# Patient Record
Sex: Male | Born: 1970 | Race: White | Hispanic: No | Marital: Married | Smoking: Never smoker
Health system: Southern US, Community
[De-identification: ages and names within clinical notes are randomized; demographics above are authoritative.]

## PROBLEM LIST (undated history)

## (undated) DIAGNOSIS — M51379 Other intervertebral disc degeneration, lumbosacral region without mention of lumbar back pain or lower extremity pain: Secondary | ICD-10-CM

## (undated) DIAGNOSIS — R351 Nocturia: Secondary | ICD-10-CM

## (undated) DIAGNOSIS — N200 Calculus of kidney: Secondary | ICD-10-CM

## (undated) DIAGNOSIS — Z8051 Family history of malignant neoplasm of kidney: Secondary | ICD-10-CM

## (undated) DIAGNOSIS — T8859XA Other complications of anesthesia, initial encounter: Secondary | ICD-10-CM

## (undated) DIAGNOSIS — K4090 Unilateral inguinal hernia, without obstruction or gangrene, not specified as recurrent: Secondary | ICD-10-CM

## (undated) DIAGNOSIS — G930 Cerebral cysts: Secondary | ICD-10-CM

## (undated) DIAGNOSIS — R053 Chronic cough: Secondary | ICD-10-CM

## (undated) DIAGNOSIS — M5137 Other intervertebral disc degeneration, lumbosacral region: Secondary | ICD-10-CM

## (undated) DIAGNOSIS — K219 Gastro-esophageal reflux disease without esophagitis: Secondary | ICD-10-CM

## (undated) DIAGNOSIS — N281 Cyst of kidney, acquired: Secondary | ICD-10-CM

## (undated) HISTORY — DX: Gastro-esophageal reflux disease without esophagitis: K21.9

## (undated) HISTORY — DX: Other intervertebral disc degeneration, lumbosacral region: M51.37

## (undated) HISTORY — PX: LUMBAR MICRODISCECTOMY: SHX99

## (undated) HISTORY — PX: BACK SURGERY: SHX140

---

## 2000-12-31 ENCOUNTER — Encounter: Payer: Self-pay | Admitting: Emergency Medicine

## 2000-12-31 ENCOUNTER — Emergency Department (HOSPITAL_COMMUNITY): Admission: EM | Admit: 2000-12-31 | Discharge: 2000-12-31 | Payer: Self-pay | Admitting: Emergency Medicine

## 2002-12-09 ENCOUNTER — Emergency Department (HOSPITAL_COMMUNITY): Admission: EM | Admit: 2002-12-09 | Discharge: 2002-12-09 | Payer: Self-pay | Admitting: Emergency Medicine

## 2002-12-10 ENCOUNTER — Emergency Department (HOSPITAL_COMMUNITY): Admission: EM | Admit: 2002-12-10 | Discharge: 2002-12-10 | Payer: Self-pay | Admitting: Internal Medicine

## 2005-07-13 ENCOUNTER — Ambulatory Visit: Payer: Self-pay | Admitting: Orthopedic Surgery

## 2005-07-20 ENCOUNTER — Ambulatory Visit (HOSPITAL_COMMUNITY)
Admission: RE | Admit: 2005-07-20 | Discharge: 2005-07-20 | Payer: Self-pay | Admitting: Physical Medicine and Rehabilitation

## 2005-07-22 ENCOUNTER — Ambulatory Visit (HOSPITAL_COMMUNITY)
Admission: RE | Admit: 2005-07-22 | Discharge: 2005-07-22 | Payer: Self-pay | Admitting: Physical Medicine and Rehabilitation

## 2005-07-22 HISTORY — PX: BACK SURGERY: SHX140

## 2005-07-24 ENCOUNTER — Ambulatory Visit (HOSPITAL_COMMUNITY): Admission: RE | Admit: 2005-07-24 | Discharge: 2005-07-26 | Payer: Self-pay | Admitting: Orthopedic Surgery

## 2005-07-24 ENCOUNTER — Encounter (INDEPENDENT_AMBULATORY_CARE_PROVIDER_SITE_OTHER): Payer: Self-pay | Admitting: Specialist

## 2005-10-19 ENCOUNTER — Ambulatory Visit (HOSPITAL_COMMUNITY): Admission: RE | Admit: 2005-10-19 | Discharge: 2005-10-19 | Payer: Self-pay | Admitting: Orthopedic Surgery

## 2005-11-26 ENCOUNTER — Ambulatory Visit (HOSPITAL_COMMUNITY): Admission: RE | Admit: 2005-11-26 | Discharge: 2005-11-27 | Payer: Self-pay | Admitting: Orthopedic Surgery

## 2005-11-26 ENCOUNTER — Encounter (INDEPENDENT_AMBULATORY_CARE_PROVIDER_SITE_OTHER): Payer: Self-pay | Admitting: *Deleted

## 2009-03-12 ENCOUNTER — Emergency Department (HOSPITAL_COMMUNITY): Admission: EM | Admit: 2009-03-12 | Discharge: 2009-03-13 | Payer: Self-pay | Admitting: Emergency Medicine

## 2009-09-24 ENCOUNTER — Ambulatory Visit (HOSPITAL_COMMUNITY): Admission: RE | Admit: 2009-09-24 | Discharge: 2009-09-24 | Payer: Self-pay | Admitting: Otolaryngology

## 2010-10-12 ENCOUNTER — Encounter: Payer: Self-pay | Admitting: Orthopedic Surgery

## 2011-02-06 NOTE — Op Note (Signed)
NAMEWENDELIN, [name]                ACCOUNT NO.:  [id number]   MEDICAL RECORD NO.:  [id number]          PATIENT TYPE:  OIB   LOCATION:  1514                         FACILITY:  [facility]   PHYSICIAN:  Marlowe Kays, M.D.  DATE OF BIRTH:  10-Mar-1971   DATE OF PROCEDURE:  DATE OF DISCHARGE:                                 OPERATIVE REPORT   PREOPERATIVE DIAGNOSES:  Herniated nucleus pulposus with free fragments, L5-  S1 left.   POSTOPERATIVE DIAGNOSES:  Herniated nucleus pulposus with free fragments, L5-  S1 left.   OPERATION:  Microdiskectomy L5-S1 left.   SURGEON:  Marlowe Kays, M.D.   ASSISTANT:  Ranee Gosselin, M.D.   ANESTHESIA:  General.   PATHOLOGY AND JUSTIFICATION FOR PROCEDURE:  He has had a 2+ week history of  severe back and left leg pain. I saw him in the office yesterday with severe  pain with weakness of plantar flexion on his left foot and an MRI which had  been obtained several days prior demonstrating the above pathology. He is  here today for the above-mentioned surgery.   PROCEDURE:  Prophylactic antibiotics, satisfactory general anesthesia, knee-  chest position on the Andrews frame, back was prepped with DuraPrep and with  three spinal needles and a lateral x-ray, I tentatively located L5-S1 space  and then continued draping the back in a sterile field. I made a midline  incision based on initial x-ray. The spinous process immediately in the  wound was tagged with a Kocher clamp and which we determined to be S1 with  the disk space just above it. Accordingly, I dissected the soft tissue off  the lamina of L5 and the sacrum which we anatomically identified as well. I  placed a self-retaining McCullough retractor. With small curette, I  undermined the sacrum and with a combination of 2 and 3 mm Kerrison rongeurs  began removing bone and ligamentum flavum working laterally and superiorly.  I then brought in the microscope to continue the more were detailed  decompression. The S1 nerve root was identified and just above the disk  space there was a large amount of disk material compressing it against the  lateral recess. This was freed up with the nerve hook and with the straight  pituitary I removed two huge fragments of disk material. This allowed [place] to  mobilize the nerve root medially, a number of potential bleeders were  coagulated with bipolar cautery. The disk space was entered with a 15 knife  blade and a good bit of additional disk material was removed. When we had  removed all disk material obtainable, we checked the L4-5 foramen was patent  to hockey stick as was the L5-S1. There were no fragments of disk material  in the axilla. The S1 nerve root as anticipated because of his pain and  weakness was a little red. There was no bleeding on closure. The wound was  irrigated with sterile saline. I placed Gelfoam soaked in thrombin over the  disk space around the nerve and dura. I then removed the self-retaining  retractors, there was no unusual bleeding.  He was given 30 mg of Toradol IV.  The wound was then closed with interrupted #1 Vicryl in the fascia, 2-0  Vicryl in the  subcutaneous tissue and staples in the skin. Betadine Adaptic dry sterile  dressing were applied. He was gently placed on his bed and at the time of  this dictation was on his way to the recovery room in satisfactory condition  with no known complications.           ______________________________  Marlowe Kays, M.D.     JA/MEDQ  D:  07/24/2005  T:  07/24/2005  Job:  [id number]

## 2011-02-06 NOTE — Op Note (Signed)
[name], [name]                ACCOUNT NO.:  [id number]   MEDICAL RECORD NO.:  [id number]          PATIENT TYPE:  OIB   LOCATION:  1515                         [facility]ACILITY:  [facility]   PHYSICIAN:  Marlowe Kays, M.D.  DATE OF BIRTH:  04-15-1971   DATE OF PROCEDURE:  11/26/2005  DATE OF DISCHARGE:                                 OPERATIVE REPORT   PREOP DIAGNOSIS:  Recurrent herniated nucleus pulposus L5-S1, left.   POSTOP DIAGNOSIS:  Recurrent herniated nucleus pulposus L5-S1, left.   OPERATION:  Microdiskectomy for recurrent herniated nucleus pulposus L5-S1,  left.   SURGEON:  Marlowe Kays, M.D.   ASSISTANT:  Georges Lynch. Darrelyn Hillock, M.D.   ANESTHESIA:  General   DESCRIPTION OF PROCEDURE:  Original surgery was done 4 months ago, at 10  weeks after surgery despite recommendations that he do no straining or heavy  lifting for least 12 weeks after surgery, he was lifting some heavy steel  and had a sudden recurrence of left leg pain with an MRI demonstrating a  recurrent disk herniation.  Because of this, he is here today for the above-  mentioned surgery see operative description below for additional details.   PROCEDURE:  Prophylactic antibiotics, satisfactory general anesthesia, knee-  chest position on the Glandorf frame. Back was prepped DuraPrep and draped in  a sterile field. Ioban employed. I excised the previous scar and went down  to the spinous processes beneath the incision, tagging, with Kocher clamps;  and found that we were actually on the sacrum. I then extended the incision  more proximally and identified what appeared to be the spinous process of L5  and S1; and I placed a curette in the interlaminar space with a second  lateral x-ray confirming that we were indeed it L5-S1. Based on this, I  continued dissecting soft tissue off the lamina of L5 and S1 using a  combination of curette and double action rongeur so we could identify the  perimeters of the previous  bony resection.  I then use combination to 2-and-  3-mm Kerrison rongeurs to begin removing bone from the superior portion of  sacrum and the inferior lamina of L5, working laterally. We then brought in  the microscope to complete the micro dissection. There was good bit of bone  laterally compressing the S1 nerve root and dura. The nerve root and the  dura were freed up. There were also extensive adhesions around the S1 nerve  root which we freed up as well. Mobilizing the dura medially a large disk  fragment exuded forth; and it is removed as one single large piece, I then  entered the interspace and found multiple other small pieces, removing all  disk material obtainable.   At the conclusion of the this, the dura and the S1 nerve root were freely  movable. There was some minor venous bleeding which we partially controlled  with bipolar cautery and finally with Gelfoam, and the wound appeared dry on  closure. After irrigating the wound well with sterile saline, he was given  30 mg of Toradol IV.  The self-retaining  McCullough retractors were removed  and the wound closed with interrupted #1 Vicryl in the fascia leaving a 1-2  cm opening superiorly in case there was any bloody oozing. I then closed the  subcu tissue with a combination of #1 and  2-0 Vicryl, and the skin with staples. The soft subcutaneous tissues were  infiltrated with 1/2% plain Marcaine. Betadine, Adaptic, and dry sterile  dressings were applied. He was gently placed on this bed and taken to the  respiratory rate in satisfactory condition with no new complications.           ______________________________  Marlowe Kays, M.D.     JA/MEDQ  D:  11/26/2005  T:  11/27/2005  Job:  [id number]

## 2012-02-16 ENCOUNTER — Other Ambulatory Visit (HOSPITAL_COMMUNITY): Payer: Self-pay | Admitting: Orthopedic Surgery

## 2012-02-16 DIAGNOSIS — M5126 Other intervertebral disc displacement, lumbar region: Secondary | ICD-10-CM

## 2012-02-17 ENCOUNTER — Ambulatory Visit (HOSPITAL_COMMUNITY)
Admission: RE | Admit: 2012-02-17 | Discharge: 2012-02-17 | Disposition: A | Discharge status: Home / Self Care | Payer: 59 | Source: Ambulatory Visit | Attending: Orthopedic Surgery | Admitting: Orthopedic Surgery

## 2012-02-17 DIAGNOSIS — M5126 Other intervertebral disc displacement, lumbar region: Secondary | ICD-10-CM

## 2012-02-17 DIAGNOSIS — M545 Low back pain, unspecified: Secondary | ICD-10-CM | POA: Insufficient documentation

## 2012-02-19 ENCOUNTER — Other Ambulatory Visit (HOSPITAL_COMMUNITY): Payer: Self-pay

## 2012-03-21 HISTORY — PX: LUMBAR SPINE SURGERY: SHX701

## 2012-06-06 ENCOUNTER — Other Ambulatory Visit (HOSPITAL_COMMUNITY): Payer: Self-pay | Admitting: Orthopedic Surgery

## 2012-06-06 ENCOUNTER — Ambulatory Visit (HOSPITAL_COMMUNITY)
Admission: RE | Admit: 2012-06-06 | Discharge: 2012-06-06 | Disposition: A | Discharge status: Home / Self Care | Payer: 59 | Source: Ambulatory Visit | Attending: Orthopedic Surgery | Admitting: Orthopedic Surgery

## 2012-06-06 DIAGNOSIS — M545 Low back pain, unspecified: Secondary | ICD-10-CM | POA: Insufficient documentation

## 2012-06-06 DIAGNOSIS — M5137 Other intervertebral disc degeneration, lumbosacral region: Secondary | ICD-10-CM | POA: Insufficient documentation

## 2012-06-06 DIAGNOSIS — M5126 Other intervertebral disc displacement, lumbar region: Secondary | ICD-10-CM | POA: Insufficient documentation

## 2012-06-06 DIAGNOSIS — M79609 Pain in unspecified limb: Secondary | ICD-10-CM | POA: Insufficient documentation

## 2012-06-06 DIAGNOSIS — M51379 Other intervertebral disc degeneration, lumbosacral region without mention of lumbar back pain or lower extremity pain: Secondary | ICD-10-CM | POA: Insufficient documentation

## 2012-06-06 LAB — CREATININE, SERUM
Creatinine, Ser: 1.09 mg/dL (ref 0.50–1.35)
GFR calc Af Amer: >90 mL/min (ref >90)
GFR calc non Af Amer: 83 mL/min — ABNORMAL LOW (ref >90)

## 2012-06-06 MED ORDER — GADOBENATE DIMEGLUMINE 529 MG/ML IV SOLN
15.0000 mL | Freq: Once | INTRAVENOUS | Status: AC | PRN
  Administered 2012-06-06: 15 mL via INTRAVENOUS

## 2012-06-14 ENCOUNTER — Other Ambulatory Visit: Payer: Self-pay | Admitting: Orthopedic Surgery

## 2012-06-14 DIAGNOSIS — M5430 Sciatica, unspecified side: Secondary | ICD-10-CM

## 2012-06-16 ENCOUNTER — Ambulatory Visit
Admission: RE | Admit: 2012-06-16 | Discharge: 2012-06-16 | Disposition: A | Discharge status: Home / Self Care | Payer: 59 | Source: Ambulatory Visit | Attending: Orthopedic Surgery | Admitting: Orthopedic Surgery

## 2012-06-16 ENCOUNTER — Other Ambulatory Visit: Payer: Self-pay | Admitting: Orthopedic Surgery

## 2012-06-16 VITALS — BP 126/80 | HR 59 | Resp 12

## 2012-06-16 DIAGNOSIS — M5430 Sciatica, unspecified side: Secondary | ICD-10-CM

## 2012-06-16 MED ORDER — MIDAZOLAM HCL 2 MG/2ML IJ SOLN
1.0000 mg | INTRAMUSCULAR | Status: DC | PRN
  Administered 2012-06-16 (×2): 1 mg via INTRAVENOUS

## 2012-06-16 MED ORDER — SODIUM CHLORIDE 0.9 % IV SOLN
Freq: Once | INTRAVENOUS | Status: AC
  Administered 2012-06-16: 12:00:00 via INTRAVENOUS

## 2012-06-16 MED ORDER — IOHEXOL 180 MG/ML  SOLN
3.5000 mL | Freq: Once | INTRAMUSCULAR | Status: AC | PRN
  Administered 2012-06-16: 3.5 mL

## 2012-06-16 MED ORDER — CEFAZOLIN SODIUM 1-5 GM-% IV SOLN
1.0000 g | Freq: Once | INTRAVENOUS | Status: AC
  Administered 2012-06-16: 1 g via INTRAVENOUS

## 2012-06-16 MED ORDER — FENTANYL CITRATE 0.05 MG/ML IJ SOLN
25.0000 ug | INTRAMUSCULAR | Status: DC | PRN
  Administered 2012-06-16 (×2): 50 ug via INTRAVENOUS

## 2012-06-16 MED ORDER — KETOROLAC TROMETHAMINE 30 MG/ML IJ SOLN
30.0000 mg | Freq: Once | INTRAMUSCULAR | Status: AC
  Administered 2012-06-16: 30 mg via INTRAVENOUS

## 2013-08-28 ENCOUNTER — Other Ambulatory Visit (HOSPITAL_COMMUNITY): Payer: Self-pay | Admitting: Family Medicine

## 2013-08-28 DIAGNOSIS — IMO0002 Reserved for concepts with insufficient information to code with codable children: Secondary | ICD-10-CM

## 2013-08-28 DIAGNOSIS — M545 Low back pain: Secondary | ICD-10-CM

## 2013-08-30 ENCOUNTER — Other Ambulatory Visit (HOSPITAL_COMMUNITY): Payer: 59

## 2013-09-08 ENCOUNTER — Other Ambulatory Visit: Payer: Self-pay | Admitting: Family Medicine

## 2013-09-08 DIAGNOSIS — IMO0002 Reserved for concepts with insufficient information to code with codable children: Secondary | ICD-10-CM

## 2013-09-08 DIAGNOSIS — M545 Low back pain: Secondary | ICD-10-CM

## 2013-09-12 ENCOUNTER — Ambulatory Visit
Admission: RE | Admit: 2013-09-12 | Discharge: 2013-09-12 | Disposition: A | Discharge status: Home / Self Care | Payer: 59 | Source: Ambulatory Visit | Attending: Family Medicine | Admitting: Family Medicine

## 2013-09-12 DIAGNOSIS — IMO0002 Reserved for concepts with insufficient information to code with codable children: Secondary | ICD-10-CM

## 2013-09-12 DIAGNOSIS — M545 Low back pain: Secondary | ICD-10-CM

## 2013-09-18 ENCOUNTER — Other Ambulatory Visit: Payer: Self-pay | Admitting: Family Medicine

## 2013-09-18 DIAGNOSIS — N289 Disorder of kidney and ureter, unspecified: Secondary | ICD-10-CM

## 2013-09-22 ENCOUNTER — Ambulatory Visit
Admission: RE | Admit: 2013-09-22 | Discharge: 2013-09-22 | Disposition: A | Discharge status: Home / Self Care | Payer: 59 | Source: Ambulatory Visit | Attending: Family Medicine | Admitting: Family Medicine

## 2013-09-22 DIAGNOSIS — N289 Disorder of kidney and ureter, unspecified: Secondary | ICD-10-CM

## 2013-09-22 MED ORDER — GADOBENATE DIMEGLUMINE 529 MG/ML IV SOLN
14.0000 mL | Freq: Once | INTRAVENOUS | Status: AC | PRN
  Administered 2013-09-22: 14 mL via INTRAVENOUS

## 2013-10-30 ENCOUNTER — Other Ambulatory Visit: Payer: Self-pay | Admitting: Urology

## 2013-10-30 DIAGNOSIS — N281 Cyst of kidney, acquired: Secondary | ICD-10-CM

## 2014-01-05 ENCOUNTER — Ambulatory Visit (INDEPENDENT_AMBULATORY_CARE_PROVIDER_SITE_OTHER): Payer: 59

## 2014-01-05 DIAGNOSIS — M722 Plantar fascial fibromatosis: Secondary | ICD-10-CM

## 2014-01-05 DIAGNOSIS — R52 Pain, unspecified: Secondary | ICD-10-CM

## 2014-01-05 DIAGNOSIS — Q6650 Congenital pes planus, unspecified foot: Secondary | ICD-10-CM

## 2014-01-05 NOTE — Progress Notes (Signed)
   Subjective:    Patient ID: [name], male    DOB: 10/17/1970, 43 y.o.   MRN: [id number]  HPI my right heel has been bothering me about a month ago and sore and tender and hurts if I stand on it for a while and hurts on the heel and hurts more if I walk on concrete not so much on grass or dirt    Review of Systems  Musculoskeletal: Positive for back pain.  All other systems reviewed and are negative.      Objective:   Physical Exam Neurovascular status is intact with pedal pulses palpable DP and PT bilateral posterior were for Refill time 3 seconds epicritic and progressive sensations intact and symmetric bilateral there is normal plantar response DTRs are listed neurologically skin color pigment normal hair growth absent nails unremarkable orthopedic biomechanical exam significant pronated foot type bilateral with pes planus with has valgus deformity and abduction of the forefoot patient has tenderness in the inferior can tubercle on the right was worse 2 weeks ago status with her in a couple weeks for you. Good new balance shoes or previous he was wearing some shoes that may be worn and broken down. X-rays confirm thickening plantar fascial structures no signs of fracture no other osseous abnormalities noted small os trigone noted no inferior calcaneal spurring noted       Assessment & Plan:  Suspect has valgus/pes planus deformity with plantar fascial symptomology right foot this time fascial strapping reappointed 2 weeks for followup ice and Advil for pain ice  Alvan Dameichard Ranjit Ashurst DPM

## 2014-01-05 NOTE — Patient Instructions (Signed)
ICE INSTRUCTIONS  Apply ice or cold pack to the affected area at least 3 times a day for 10-15 minutes each time.  You should also use ice after prolonged activity or vigorous exercise.  Do not apply ice longer than 20 minutes at one time.  Always keep a cloth between your skin and the ice pack to prevent burns.  Being consistent and following these instructions will help control your symptoms.  We suggest you purchase a gel ice pack because they are reusable and do bit leak.  Some of them are designed to wrap around the area.  Use the method that works best for you.  Here are some other suggestions for icing.   Use a frozen bag of peas or corn-inexpensive and molds well to your body, usually stays frozen for 10 to 20 minutes.  Wet a towel with cold water and squeeze out the excess until it's damp.  Place in a bag in the freezer for 20 minutes. Then remove and use.  Recommended Advil or ibuprofen as needed for pain

## 2014-01-26 ENCOUNTER — Encounter (INDEPENDENT_AMBULATORY_CARE_PROVIDER_SITE_OTHER): Payer: 59

## 2014-01-26 VITALS — BP 130/79 | HR 76 | Resp 14 | Ht 70.0 in | Wt 153.0 lb

## 2014-01-26 DIAGNOSIS — R52 Pain, unspecified: Secondary | ICD-10-CM

## 2014-01-26 DIAGNOSIS — Q6650 Congenital pes planus, unspecified foot: Secondary | ICD-10-CM

## 2014-01-26 DIAGNOSIS — M722 Plantar fascial fibromatosis: Secondary | ICD-10-CM

## 2014-01-26 NOTE — Patient Instructions (Signed)

## 2014-01-26 NOTE — Progress Notes (Signed)
Pt left without being seen by Dr. Ralene CorkSikora, after waiting a hour.  Pt states he would reschedule with another doctor.

## 2014-01-26 NOTE — Progress Notes (Signed)
   Subjective:    Patient ID: [name], male    DOB: 03/03/1971, 43 y.o.   MRN: [id number]  HPI Comments: Pt states his foot is better and would like to discuss the orthotics.     Review of Systems no new changes or findings noted    Objective:   Physical Exam        Assessment & Plan:

## 2014-02-23 ENCOUNTER — Ambulatory Visit (INDEPENDENT_AMBULATORY_CARE_PROVIDER_SITE_OTHER): Payer: 59

## 2014-02-23 VITALS — BP 134/85 | HR 68 | Resp 16

## 2014-02-23 DIAGNOSIS — M722 Plantar fascial fibromatosis: Secondary | ICD-10-CM

## 2014-02-23 DIAGNOSIS — R52 Pain, unspecified: Secondary | ICD-10-CM

## 2014-02-23 DIAGNOSIS — Q6650 Congenital pes planus, unspecified foot: Secondary | ICD-10-CM

## 2014-02-23 NOTE — Patient Instructions (Signed)

## 2014-02-23 NOTE — Progress Notes (Signed)
   Subjective:    Patient ID: [name], male    DOB: Oct 24, 1970, 43 y.o.   MRN: [id number]  HPI Comments: "My foot is actually fine. Trying to decide if I want to do the orthotics"  Plantar Fasciitis - Follow up plantar heel right foot      Review of Systems no new systemic changes or findings noted     Objective:   Physical Exam Neurovascular status intact pedal pulses are palpable patient had improvement with the fascial strapping and placed and based on that is a candidate for functional orthoses understands orthotics are noncovered will make arrangements for payment plan for the orthotics. The orthotic uses explained written instructions given to the patient will skin in followup with in 4 weeks for fitting of orthotics and initiate orthotic use       Assessment & Plan:  Assessment plantar fasciitis/heel spur syndrome would benefit from functional orthoses which are prescribed at this time patient will followup in the next month orthotic pickup and fitting in the meantime also give recommendations about proper shoes athletic shoe such as new balance or Shon Baton work boots such as Redwing recommended at this time. 4 week followup  Alvan Dame DPM

## 2014-03-16 ENCOUNTER — Ambulatory Visit (INDEPENDENT_AMBULATORY_CARE_PROVIDER_SITE_OTHER): Payer: 59 | Admitting: *Deleted

## 2014-03-16 DIAGNOSIS — M722 Plantar fascial fibromatosis: Secondary | ICD-10-CM

## 2014-03-16 NOTE — Patient Instructions (Signed)

## 2014-03-16 NOTE — Progress Notes (Signed)
Dispensed patient's orthotics with oral and written instructions for wearing. Patient will follow up with Dr. Ralene CorkSikora in 6 week for an orthotic check.

## 2014-04-20 ENCOUNTER — Ambulatory Visit
Admission: RE | Admit: 2014-04-20 | Discharge: 2014-04-20 | Disposition: A | Discharge status: Home / Self Care | Payer: 59 | Source: Ambulatory Visit | Attending: Urology | Admitting: Urology

## 2014-04-20 DIAGNOSIS — N281 Cyst of kidney, acquired: Secondary | ICD-10-CM

## 2014-04-27 ENCOUNTER — Ambulatory Visit: Payer: 59

## 2014-09-12 DIAGNOSIS — M722 Plantar fascial fibromatosis: Secondary | ICD-10-CM

## 2016-10-13 DIAGNOSIS — H52223 Regular astigmatism, bilateral: Secondary | ICD-10-CM | POA: Diagnosis not present

## 2016-10-13 DIAGNOSIS — H524 Presbyopia: Secondary | ICD-10-CM | POA: Diagnosis not present

## 2016-10-13 DIAGNOSIS — H18413 Arcus senilis, bilateral: Secondary | ICD-10-CM | POA: Diagnosis not present

## 2016-10-13 DIAGNOSIS — H5201 Hypermetropia, right eye: Secondary | ICD-10-CM | POA: Diagnosis not present

## 2016-12-07 DIAGNOSIS — M47816 Spondylosis without myelopathy or radiculopathy, lumbar region: Secondary | ICD-10-CM | POA: Diagnosis not present

## 2016-12-24 DIAGNOSIS — H10502 Unspecified blepharoconjunctivitis, left eye: Secondary | ICD-10-CM | POA: Diagnosis not present

## 2016-12-28 DIAGNOSIS — H10502 Unspecified blepharoconjunctivitis, left eye: Secondary | ICD-10-CM | POA: Diagnosis not present

## 2017-01-13 DIAGNOSIS — H01001 Unspecified blepharitis right upper eyelid: Secondary | ICD-10-CM | POA: Diagnosis not present

## 2017-03-09 DIAGNOSIS — M47816 Spondylosis without myelopathy or radiculopathy, lumbar region: Secondary | ICD-10-CM | POA: Diagnosis not present

## 2017-04-12 DIAGNOSIS — N281 Cyst of kidney, acquired: Secondary | ICD-10-CM | POA: Diagnosis not present

## 2017-04-12 DIAGNOSIS — N200 Calculus of kidney: Secondary | ICD-10-CM | POA: Diagnosis not present

## 2017-04-13 ENCOUNTER — Other Ambulatory Visit (HOSPITAL_COMMUNITY): Payer: Self-pay | Admitting: Neurosurgery

## 2017-04-13 DIAGNOSIS — M47816 Spondylosis without myelopathy or radiculopathy, lumbar region: Secondary | ICD-10-CM

## 2017-04-14 ENCOUNTER — Emergency Department (HOSPITAL_COMMUNITY): Payer: 59

## 2017-04-14 ENCOUNTER — Encounter (HOSPITAL_COMMUNITY): Payer: Self-pay | Admitting: *Deleted

## 2017-04-14 ENCOUNTER — Emergency Department (HOSPITAL_COMMUNITY)
Admission: EM | Admit: 2017-04-14 | Discharge: 2017-04-14 | Disposition: A | Discharge status: Home / Self Care | Payer: 59 | Attending: Emergency Medicine | Admitting: Emergency Medicine

## 2017-04-14 DIAGNOSIS — S6992XA Unspecified injury of left wrist, hand and finger(s), initial encounter: Secondary | ICD-10-CM | POA: Diagnosis present

## 2017-04-14 DIAGNOSIS — S61216A Laceration without foreign body of right little finger without damage to nail, initial encounter: Secondary | ICD-10-CM | POA: Diagnosis not present

## 2017-04-14 DIAGNOSIS — Y999 Unspecified external cause status: Secondary | ICD-10-CM | POA: Diagnosis not present

## 2017-04-14 DIAGNOSIS — S61315A Laceration without foreign body of left ring finger with damage to nail, initial encounter: Secondary | ICD-10-CM | POA: Insufficient documentation

## 2017-04-14 DIAGNOSIS — Y9389 Activity, other specified: Secondary | ICD-10-CM | POA: Insufficient documentation

## 2017-04-14 DIAGNOSIS — Y92009 Unspecified place in unspecified non-institutional (private) residence as the place of occurrence of the external cause: Secondary | ICD-10-CM | POA: Diagnosis not present

## 2017-04-14 DIAGNOSIS — W312XXA Contact with powered woodworking and forming machines, initial encounter: Secondary | ICD-10-CM | POA: Insufficient documentation

## 2017-04-14 DIAGNOSIS — Z23 Encounter for immunization: Secondary | ICD-10-CM | POA: Insufficient documentation

## 2017-04-14 MED ORDER — TETANUS-DIPHTH-ACELL PERTUSSIS 5-2.5-18.5 LF-MCG/0.5 IM SUSP
0.5000 mL | Freq: Once | INTRAMUSCULAR | Status: AC
  Administered 2017-04-14: 0.5 mL via INTRAMUSCULAR
  Filled 2017-04-14: qty 0.5

## 2017-04-14 MED ORDER — HYDROCODONE-ACETAMINOPHEN 5-325 MG PO TABS: 1.0000 | ORAL_TABLET | ORAL | 0 refills | Status: DC | PRN

## 2017-04-14 MED ORDER — POVIDONE-IODINE 10 % EX SOLN
CUTANEOUS | Status: DC | PRN
  Filled 2017-04-14: qty 15

## 2017-04-14 MED ORDER — HYDROCODONE-ACETAMINOPHEN 5-325 MG PO TABS
1.0000 | ORAL_TABLET | Freq: Once | ORAL | Status: AC
  Administered 2017-04-14: 1 via ORAL
  Filled 2017-04-14: qty 1

## 2017-04-14 MED ORDER — LIDOCAINE HCL (PF) 2 % IJ SOLN
INTRAMUSCULAR | Status: AC
  Filled 2017-04-14: qty 10

## 2017-04-14 MED ORDER — CEPHALEXIN 500 MG PO CAPS: 500.0000 mg | ORAL_CAPSULE | Freq: Four times a day (QID) | ORAL | 0 refills | Status: DC

## 2017-04-14 MED ORDER — LIDOCAINE HCL (PF) 2 % IJ SOLN: 2.0000 mL | Freq: Once | INTRAMUSCULAR | Status: DC

## 2017-04-14 NOTE — Discharge Instructions (Signed)
Keep your finger clean, dry and covered.  The stitches will fall away on their own.  Take the antibiotic until completed.  As this injury continues to heal, you will need to keep the distal nail cut extremely short and you may want to consider continued use of the splint and tell the nail has completely grown out be on the basis of its injury.  Call Dr. Romeo AppleHarrison or return here for any problems or concerns with this injury.You may take the hydrocodone prescribed for pain relief.  This will make you drowsy - do not drive within 4 hours of taking this medication.

## 2017-04-14 NOTE — ED Provider Notes (Signed)
[facility]-EMERGENCY DEPT Provider Note   CSN: [id number] Arrival date & time: 04/14/17  1654     History   Chief Complaint Chief Complaint  Patient presents with  . Finger Injury    HPI [name] is a 46 y.o. male presenting with fingertip injury occurring just prior to arrival.  He was working at home using a milling machine cutting wood when he lacerated his left ring finger through the nail plate.  He reports copious bleeding and persistent pain at the finger tip, bleeding resolved with pressure application.  He denies numbness, radiation of pain or other injury.  He is not current with his tetanus.  The history is provided by the patient.    Past Medical History:  Diagnosis Date  . GERD (gastroesophageal reflux disease)     There are no active problems to display for this patient.   Past Surgical History:  Procedure Laterality Date  . BACK SURGERY  07/22/2005   to remove fragments and pieces that had come loose  . BACK SURGERY         Home Medications    Prior to Admission medications   Medication Sig Start Date End Date Taking? Authorizing Provider  cephALEXin (KEFLEX) 500 MG capsule Take 1 capsule (500 mg total) by mouth 4 (four) times daily. 04/14/17   Burgess AmorIdol, Meril Dray, PA-C  HYDROcodone-acetaminophen (NORCO/VICODIN) 5-325 MG tablet Take 1 tablet by mouth every 4 (four) hours as needed. 04/14/17   Burgess AmorIdol, Xochilth Standish, PA-C    Family History No family history on file.  Social History Social History  Substance Use Topics  . Smoking status: Never Smoker  . Smokeless tobacco: Never Used  . Alcohol use No     Allergies   Patient has no known allergies.   Review of Systems Review of Systems  Constitutional: Negative for fever.  Musculoskeletal: Positive for arthralgias. Negative for joint swelling and myalgias.  Skin: Positive for wound.  Neurological: Negative for weakness and numbness.     Physical Exam Updated Vital Signs BP 130/81   Pulse (!) 52    Temp 97.7 F (36.5 C) (Oral)   Resp 18   Ht 5\' 10"  (1.778 m)   Wt 70.3 kg (155 lb)   SpO2 100%   BMI 22.24 kg/m   Physical Exam  Constitutional: He is oriented to person, place, and time. He appears well-developed and well-nourished.  HENT:  Head: Normocephalic.  Cardiovascular: Normal rate.   Pulmonary/Chest: Effort normal.  Musculoskeletal: He exhibits tenderness. He exhibits no deformity.  Neurological: He is alert and oriented to person, place, and time. No sensory deficit.  Skin: Laceration noted.  Small laceration involving the nailplate and bed of the left ring finger.  There is 2 separate fractures through the distal nail plate forming 2 triangular separate pieces of nail, one firmly attached to the bed the other looser but attached. The looser wedge is atttached to the nail bed with a deep avulsive portion of tissue attached to the plate, phalange is not visualized within the base of this wound.   Distal sensation intact.  No bony deformity appreciated.     ED Treatments / Results  Labs (all labs ordered are listed, but only abnormal results are displayed) Labs Reviewed - No data to display  EKG  EKG Interpretation None       Radiology Dg Finger Ring Left  Result Date: 04/14/2017 CLINICAL DATA:  Recent laceration to the distal fourth digit, initial encounter EXAM: LEFT RING FINGER 2+V  COMPARISON:  None. FINDINGS: There is no evidence of fracture or dislocation. There is no evidence of arthropathy or other focal bone abnormality. Soft tissues are unremarkable. IMPRESSION: No acute abnormality noted. Electronically Signed   By: Alcide CleverMark  Lukens M.D.   On: 04/14/2017 18:20    Procedures Procedures (including critical care time)  LACERATION REPAIR Performed by: Burgess AmorIDOL, Tracina Beaumont Authorized by: Burgess AmorIDOL, Ottie Neglia Consent: Verbal consent obtained. Risks and benefits: risks, benefits and alternatives were discussed Consent given by: patient Patient identity confirmed: provided  demographic data Prepped and Draped in normal sterile fashion Wound explored  Laceration Location: left ring finger  Laceration Length: 0.5 cm  No Foreign Bodies seen or palpated  Anesthesia: digital block  Local anesthetic: lidocaine 2% without epinephrine  Anesthetic total: 2 ml  Irrigation method: syringe using saline after betadine wash.  Amount of cleaning: copious  Skin closure: simple gut 4-0  Number of sutures: 2 tacking stitches through nail plate  Technique: simple interupted  Patient tolerance: Patient tolerated the procedure well with no immediate complications.   Medications Ordered in ED Medications  lidocaine (XYLOCAINE) 2 % injection 2 mL (not administered)  povidone-iodine (BETADINE) 10 % external solution (not administered)  lidocaine (XYLOCAINE) 2 % injection (not administered)  Tdap (BOOSTRIX) injection 0.5 mL (0.5 mLs Intramuscular Given 04/14/17 1950)  HYDROcodone-acetaminophen (NORCO/VICODIN) 5-325 MG per tablet 1 tablet (1 tablet Oral Given 04/14/17 2021)     Initial Impression / Assessment and Plan / ED Course  I have reviewed the triage vital signs and the nursing notes.  Pertinent labs & imaging results that were available during my care of the patient were reviewed by me and considered in my medical decision making (see chart for details).     Pt given tetanus, prescribed keflex and hydrocodone for pain relief. Advised f/u with ortho for f/u care, discussed the nail bed needs to heal completely to avoid chronic nail deformity. I am hopeful that using the nail as a splint will allow the deeper injury to heal without sequalae. F/u recommened and referral to Dr Romeo AppleHarrison given.   Final Clinical Impressions(s) / ED Diagnoses   Final diagnoses:  Laceration of left ring finger without foreign body with damage to nail, initial encounter    New Prescriptions New Prescriptions   CEPHALEXIN (KEFLEX) 500 MG CAPSULE    Take 1 capsule (500 mg total)  by mouth 4 (four) times daily.   HYDROCODONE-ACETAMINOPHEN (NORCO/VICODIN) 5-325 MG TABLET    Take 1 tablet by mouth every 4 (four) hours as needed.     Burgess Amordol, Kaileigh Viswanathan, PA-C 04/15/17 Leatha [id number]    James, Mark, MD 04/29/17 [phone]

## 2017-04-14 NOTE — ED Triage Notes (Signed)
Pt was using a milling machine today and cut his left middle finger. Cut appears to be on the nail. No bleeding at this time.

## 2017-04-19 ENCOUNTER — Ambulatory Visit (HOSPITAL_COMMUNITY)
Admission: RE | Admit: 2017-04-19 | Discharge: 2017-04-19 | Disposition: A | Discharge status: Home / Self Care | Payer: 59 | Source: Ambulatory Visit | Attending: Neurosurgery | Admitting: Neurosurgery

## 2017-04-19 DIAGNOSIS — M47816 Spondylosis without myelopathy or radiculopathy, lumbar region: Secondary | ICD-10-CM | POA: Insufficient documentation

## 2017-04-19 DIAGNOSIS — M5126 Other intervertebral disc displacement, lumbar region: Secondary | ICD-10-CM | POA: Diagnosis not present

## 2017-04-19 DIAGNOSIS — M5136 Other intervertebral disc degeneration, lumbar region: Secondary | ICD-10-CM | POA: Diagnosis not present

## 2017-04-19 MED ORDER — GADOBENATE DIMEGLUMINE 529 MG/ML IV SOLN
15.0000 mL | Freq: Once | INTRAVENOUS | Status: AC | PRN
  Administered 2017-04-19: 14 mL via INTRAVENOUS

## 2017-04-27 DIAGNOSIS — S61315S Laceration without foreign body of left ring finger with damage to nail, sequela: Secondary | ICD-10-CM | POA: Diagnosis not present

## 2017-04-27 DIAGNOSIS — Z4802 Encounter for removal of sutures: Secondary | ICD-10-CM | POA: Diagnosis not present

## 2017-04-28 DIAGNOSIS — M47816 Spondylosis without myelopathy or radiculopathy, lumbar region: Secondary | ICD-10-CM | POA: Diagnosis not present

## 2017-05-17 DIAGNOSIS — M5441 Lumbago with sciatica, right side: Secondary | ICD-10-CM | POA: Diagnosis not present

## 2017-05-17 DIAGNOSIS — M5442 Lumbago with sciatica, left side: Secondary | ICD-10-CM | POA: Diagnosis not present

## 2017-05-17 DIAGNOSIS — G8929 Other chronic pain: Secondary | ICD-10-CM | POA: Diagnosis not present

## 2017-06-29 DIAGNOSIS — M5137 Other intervertebral disc degeneration, lumbosacral region: Secondary | ICD-10-CM | POA: Diagnosis not present

## 2017-06-29 DIAGNOSIS — M545 Low back pain: Secondary | ICD-10-CM | POA: Diagnosis not present

## 2017-06-29 DIAGNOSIS — G8929 Other chronic pain: Secondary | ICD-10-CM | POA: Diagnosis not present

## 2017-06-30 DIAGNOSIS — M4317 Spondylolisthesis, lumbosacral region: Secondary | ICD-10-CM | POA: Diagnosis not present

## 2017-06-30 DIAGNOSIS — M5417 Radiculopathy, lumbosacral region: Secondary | ICD-10-CM | POA: Diagnosis not present

## 2017-06-30 DIAGNOSIS — G894 Chronic pain syndrome: Secondary | ICD-10-CM | POA: Diagnosis not present

## 2017-06-30 DIAGNOSIS — Z79891 Long term (current) use of opiate analgesic: Secondary | ICD-10-CM | POA: Diagnosis not present

## 2017-07-02 DIAGNOSIS — M545 Low back pain: Secondary | ICD-10-CM | POA: Diagnosis not present

## 2017-07-02 DIAGNOSIS — G8929 Other chronic pain: Secondary | ICD-10-CM | POA: Diagnosis not present

## 2017-07-16 DIAGNOSIS — M545 Low back pain: Secondary | ICD-10-CM | POA: Diagnosis not present

## 2017-07-16 DIAGNOSIS — G8929 Other chronic pain: Secondary | ICD-10-CM | POA: Diagnosis not present

## 2017-07-20 DIAGNOSIS — G8929 Other chronic pain: Secondary | ICD-10-CM | POA: Diagnosis not present

## 2017-07-20 DIAGNOSIS — M545 Low back pain: Secondary | ICD-10-CM | POA: Diagnosis not present

## 2017-07-21 DIAGNOSIS — M47817 Spondylosis without myelopathy or radiculopathy, lumbosacral region: Secondary | ICD-10-CM | POA: Diagnosis not present

## 2017-07-21 DIAGNOSIS — S6992XA Unspecified injury of left wrist, hand and finger(s), initial encounter: Secondary | ICD-10-CM | POA: Diagnosis not present

## 2017-07-23 DIAGNOSIS — G8929 Other chronic pain: Secondary | ICD-10-CM | POA: Diagnosis not present

## 2017-07-23 DIAGNOSIS — M545 Low back pain: Secondary | ICD-10-CM | POA: Diagnosis not present

## 2017-07-27 DIAGNOSIS — G8929 Other chronic pain: Secondary | ICD-10-CM | POA: Diagnosis not present

## 2017-07-27 DIAGNOSIS — M545 Low back pain: Secondary | ICD-10-CM | POA: Diagnosis not present

## 2017-08-02 DIAGNOSIS — G8929 Other chronic pain: Secondary | ICD-10-CM | POA: Diagnosis not present

## 2017-08-02 DIAGNOSIS — M545 Low back pain: Secondary | ICD-10-CM | POA: Diagnosis not present

## 2017-08-05 DIAGNOSIS — G8929 Other chronic pain: Secondary | ICD-10-CM | POA: Diagnosis not present

## 2017-08-05 DIAGNOSIS — M545 Low back pain: Secondary | ICD-10-CM | POA: Diagnosis not present

## 2017-08-10 DIAGNOSIS — M545 Low back pain: Secondary | ICD-10-CM | POA: Diagnosis not present

## 2017-08-10 DIAGNOSIS — G8929 Other chronic pain: Secondary | ICD-10-CM | POA: Diagnosis not present

## 2017-08-16 DIAGNOSIS — G8929 Other chronic pain: Secondary | ICD-10-CM | POA: Diagnosis not present

## 2017-08-16 DIAGNOSIS — M545 Low back pain: Secondary | ICD-10-CM | POA: Diagnosis not present

## 2017-08-19 DIAGNOSIS — M47817 Spondylosis without myelopathy or radiculopathy, lumbosacral region: Secondary | ICD-10-CM | POA: Diagnosis not present

## 2017-08-19 DIAGNOSIS — G894 Chronic pain syndrome: Secondary | ICD-10-CM | POA: Diagnosis not present

## 2017-08-19 DIAGNOSIS — M5416 Radiculopathy, lumbar region: Secondary | ICD-10-CM | POA: Diagnosis not present

## 2017-08-25 DIAGNOSIS — M4716 Other spondylosis with myelopathy, lumbar region: Secondary | ICD-10-CM | POA: Diagnosis not present

## 2017-09-03 DIAGNOSIS — M5416 Radiculopathy, lumbar region: Secondary | ICD-10-CM | POA: Diagnosis not present

## 2017-09-16 DIAGNOSIS — G894 Chronic pain syndrome: Secondary | ICD-10-CM | POA: Diagnosis not present

## 2017-09-16 DIAGNOSIS — M47817 Spondylosis without myelopathy or radiculopathy, lumbosacral region: Secondary | ICD-10-CM | POA: Diagnosis not present

## 2017-09-16 DIAGNOSIS — M5417 Radiculopathy, lumbosacral region: Secondary | ICD-10-CM | POA: Diagnosis not present

## 2018-07-21 IMAGING — MR MR LUMBAR SPINE WO/W CM
4 of 7 series · 15 of 48 positions shown · IV contrast (14ml Multihance)
Comparison: CT lumbar discography dated June 16, 2012.

CLINICAL DATA: Low back pain radiating down the left leg. No known
injury.

EXAM:
MRI LUMBAR SPINE WITHOUT AND WITH CONTRAST
TECHNIQUE: Multiplanar and multiecho pulse sequences of the lumbar spine were
obtained without and with intravenous contrast.
CONTRAST:  14mL MULTIHANCE GADOBENATE DIMEGLUMINE 529 MG/ML IV SOLN

[Series 3: T1 · sagittal · 4.0mm · 0.47mm/px · 3 of 13 slices shown (1 of 2)]
[im 1/13]
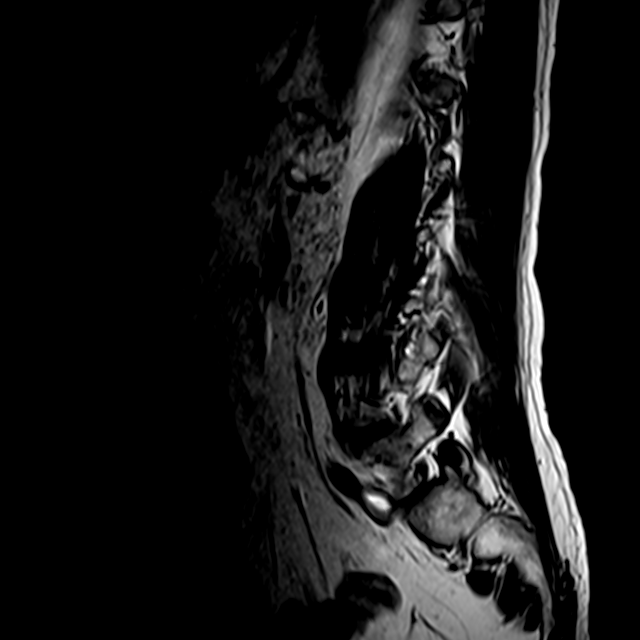
[im 7/13]
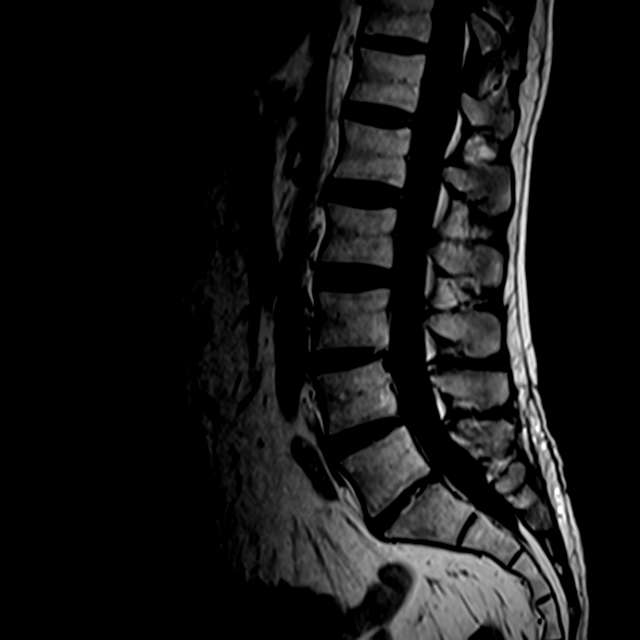
[im 13/13]
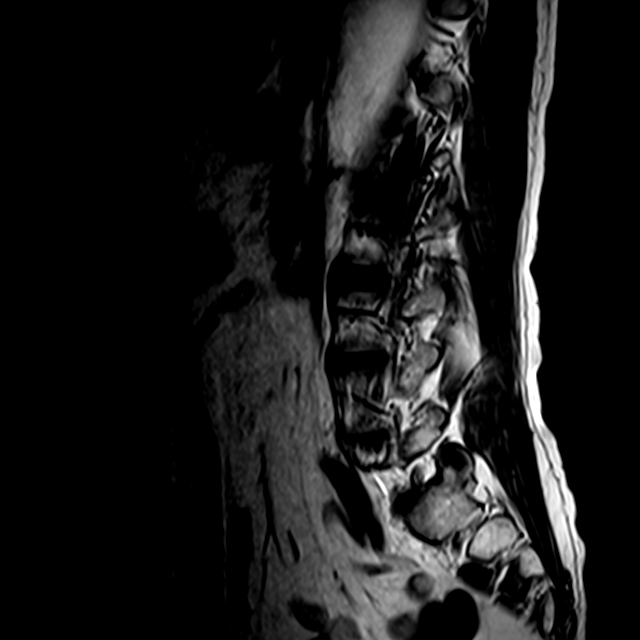

[Series 5: T2 · axial · 4.0mm · 0.27mm/px · z∈[-109,+68]mm · 6 of 36 slices shown (1 of 2)]
[im 1/36]
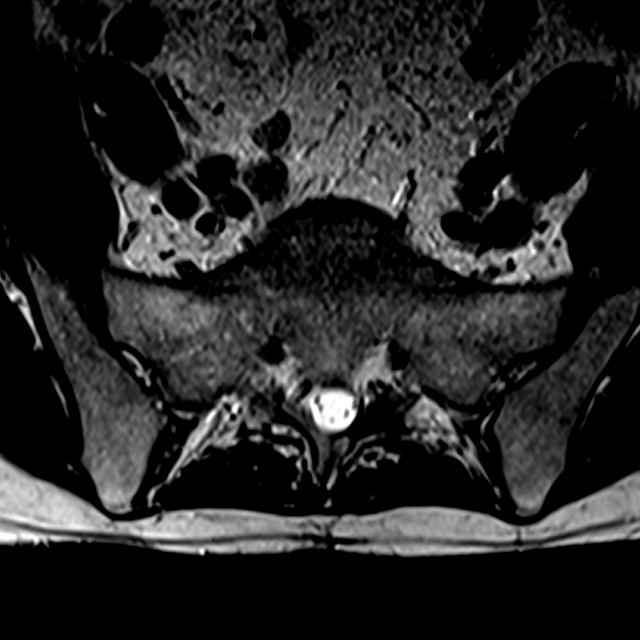
[im 4/36]
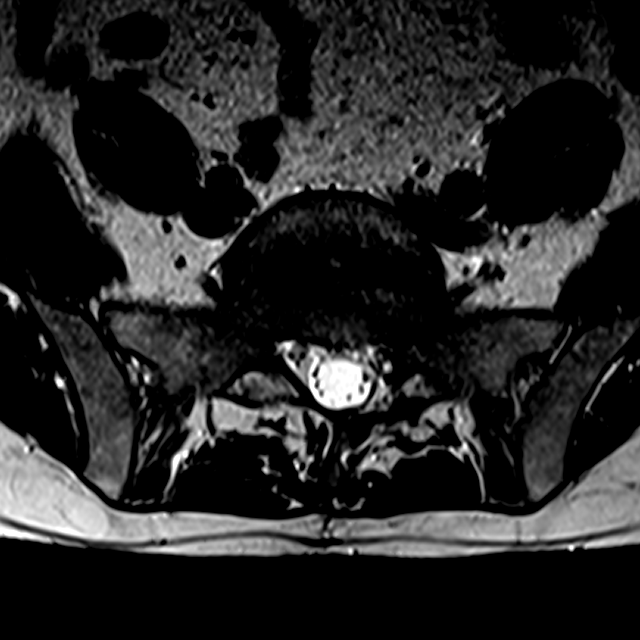
[im 8/36]
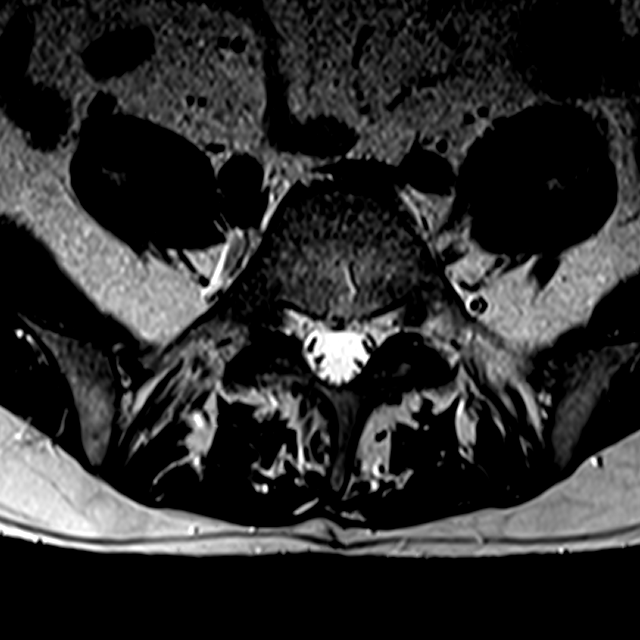
[im 11/36]
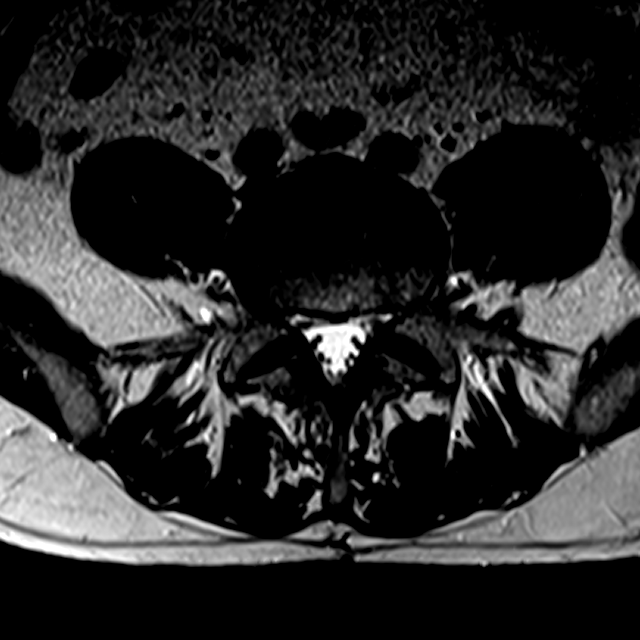
[im 18/36]
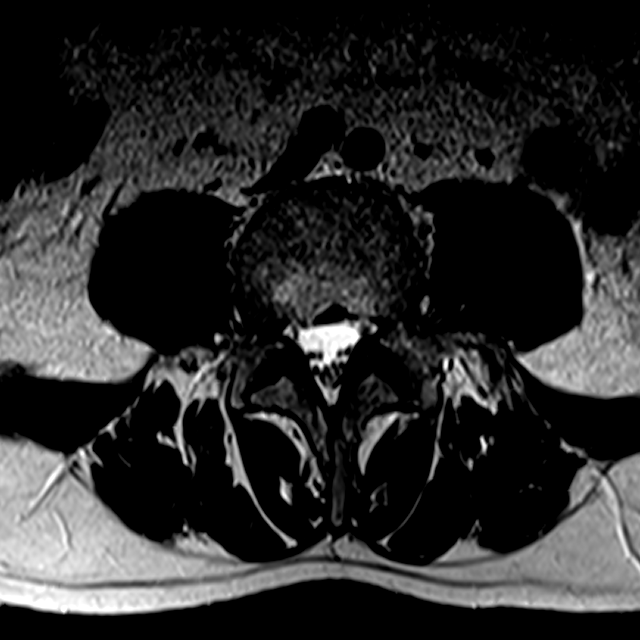
[im 32/36]
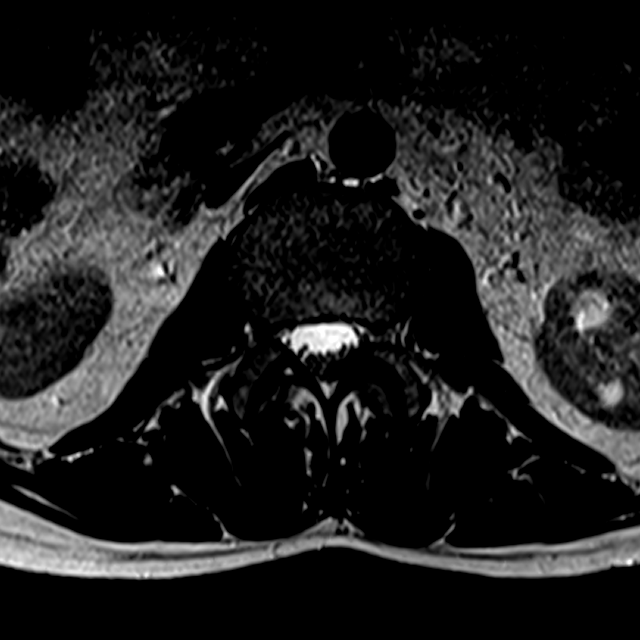

[Series 6: T1 · axial · 4.0mm · 0.27mm/px · z∈[-96,+68]mm · 3 of 36 slices shown (2 of 2)]
[im 4/36]
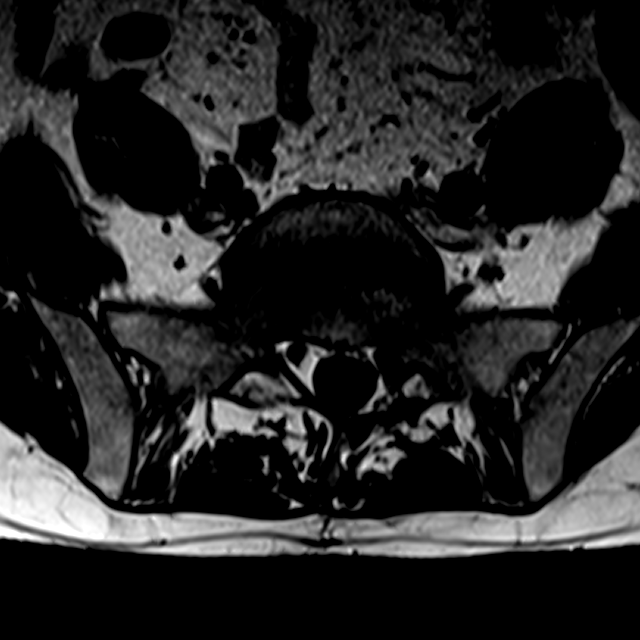
[im 18/36]
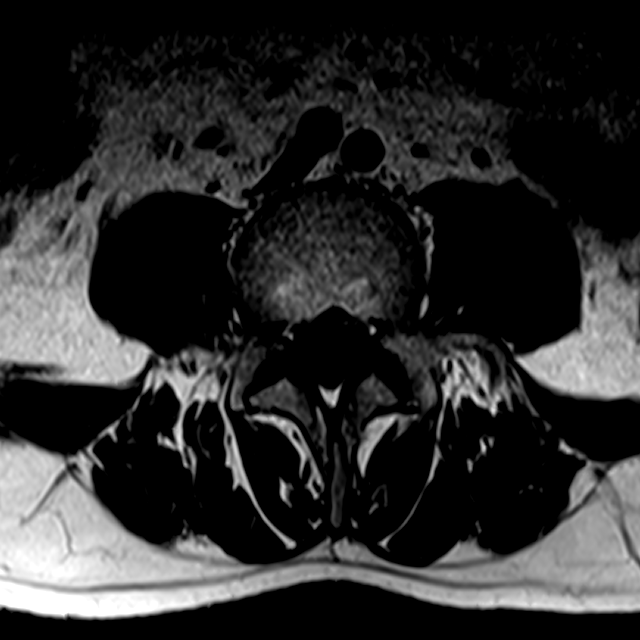
[im 32/36]
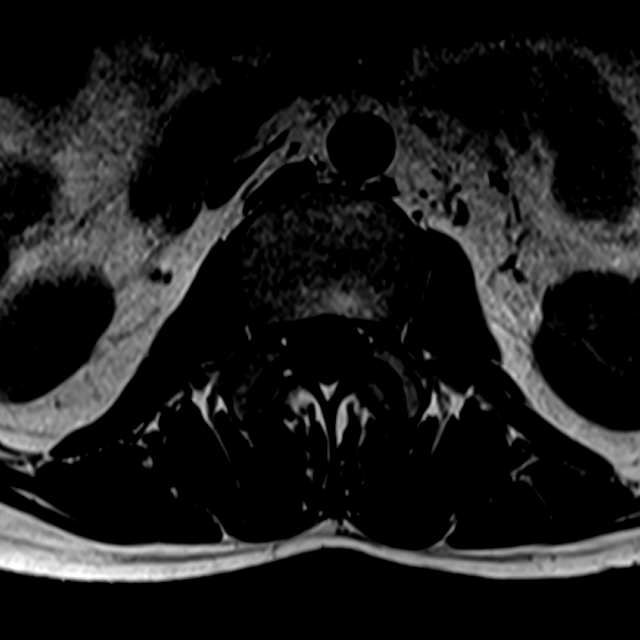

[Series 7: T2 · sagittal · 4.0mm · 0.79mm/px · 3 of 13 slices shown (2 of 2)]
[im 1/13]
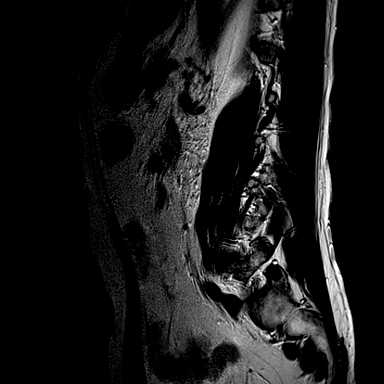
[im 9/13]
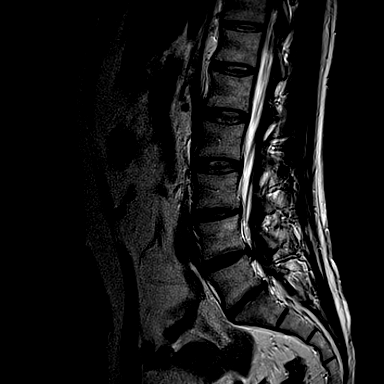
[im 13/13]
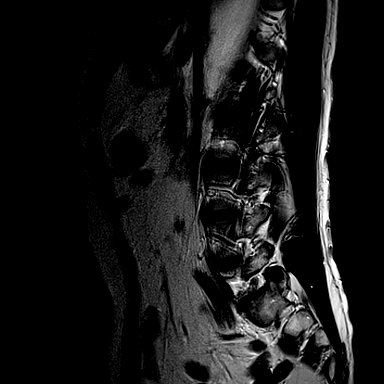

[15 of 48 positions shown; findings below may reference images not displayed]

FINDINGS: Segmentation:  Standard.

Alignment:  Physiologic.

Vertebrae:  No fracture, evidence of discitis, or bone lesion.

Conus medullaris: Extends to the L1 level and appears normal. No
abnormal enhancement identified.

Paraspinal and other soft tissues: Negative.

Disc levels:

T12-L1:  Normal.

L1-L2:  Normal.

L2-L3:  Normal.

L3-L4: Small annular fissure. Small disc bulge without spinal canal
or neuroforaminal stenosis.

L4-L5: Small annular fissure. Diffuse disc bulge slightly asymmetric
to the right resulting in moderate right lateral recess narrowing
and possible impingement on the descending right L5 nerve root. No
significant spinal canal or neuroforaminal stenosis.

L5-S1: Small diffuse disc bulge, slightly asymmetric to the left,
resulting in mild left neuroforaminal stenosis. No spinal canal or
right neuroforaminal stenosis.
IMPRESSION: 1. Mild degenerative disc disease of the lower lumbar spine with
moderate right lateral recess narrowing at L4-L5 and possible
impingement on the descending right L5 nerve root.
2. No high-grade spinal canal or neuroforaminal stenosis at any
level.

## 2020-01-30 ENCOUNTER — Emergency Department (HOSPITAL_COMMUNITY): Payer: Medicare HMO

## 2020-01-30 ENCOUNTER — Encounter (HOSPITAL_COMMUNITY): Payer: Self-pay | Admitting: Emergency Medicine

## 2020-01-30 ENCOUNTER — Emergency Department (HOSPITAL_COMMUNITY)
Admission: EM | Admit: 2020-01-30 | Discharge: 2020-01-30 | Disposition: A | Discharge status: Home / Self Care | Payer: Medicare HMO | Source: Home / Self Care | Attending: Emergency Medicine | Admitting: Emergency Medicine

## 2020-01-30 ENCOUNTER — Other Ambulatory Visit: Payer: Self-pay

## 2020-01-30 ENCOUNTER — Encounter (HOSPITAL_COMMUNITY): Payer: Self-pay

## 2020-01-30 ENCOUNTER — Emergency Department (HOSPITAL_COMMUNITY)
Admission: EM | Admit: 2020-01-30 | Discharge: 2020-01-30 | Disposition: A | Discharge status: Home / Self Care | Payer: Medicare HMO | Attending: Emergency Medicine | Admitting: Emergency Medicine

## 2020-01-30 DIAGNOSIS — K529 Noninfective gastroenteritis and colitis, unspecified: Secondary | ICD-10-CM | POA: Insufficient documentation

## 2020-01-30 DIAGNOSIS — R197 Diarrhea, unspecified: Secondary | ICD-10-CM | POA: Diagnosis not present

## 2020-01-30 DIAGNOSIS — Z5321 Procedure and treatment not carried out due to patient leaving prior to being seen by health care provider: Secondary | ICD-10-CM | POA: Insufficient documentation

## 2020-01-30 DIAGNOSIS — R1110 Vomiting, unspecified: Secondary | ICD-10-CM | POA: Diagnosis present

## 2020-01-30 DIAGNOSIS — R112 Nausea with vomiting, unspecified: Secondary | ICD-10-CM | POA: Insufficient documentation

## 2020-01-30 LAB — CBC
HCT: 55 % — ABNORMAL HIGH (ref 39.0–52.0)
Hemoglobin: 17.8 g/dL — ABNORMAL HIGH (ref 13.0–17.0)
MCH: 30.6 pg (ref 26.0–34.0)
MCHC: 32.4 g/dL (ref 30.0–36.0)
MCV: 94.7 fL (ref 80.0–100.0)
Platelets: 282 10*3/uL (ref 150–400)
RBC: 5.81 MIL/uL (ref 4.22–5.81)
RDW: 12.3 % (ref 11.5–15.5)
WBC: 10.6 10*3/uL — ABNORMAL HIGH (ref 4.0–10.5)
nRBC: 0 % (ref 0.0–0.2)

## 2020-01-30 LAB — COMPREHENSIVE METABOLIC PANEL
ALT: 25 U/L (ref 0–44)
AST: 27 U/L (ref 15–41)
Albumin: 5.3 g/dL — ABNORMAL HIGH (ref 3.5–5.0)
Alkaline Phosphatase: 53 U/L (ref 38–126)
Anion gap: 18 — ABNORMAL HIGH (ref 5–15)
BUN: 19 mg/dL (ref 6–20)
CO2: 21 mmol/L — ABNORMAL LOW (ref 22–32)
Calcium: 10.4 mg/dL — ABNORMAL HIGH (ref 8.9–10.3)
Chloride: 103 mmol/L (ref 98–111)
Creatinine, Ser: 1.2 mg/dL (ref 0.61–1.24)
GFR calc Af Amer: >60 mL/min (ref >60)
GFR calc non Af Amer: >60 mL/min (ref >60)
Glucose, Bld: 158 mg/dL — ABNORMAL HIGH (ref 70–99)
Potassium: 3.7 mmol/L (ref 3.5–5.1)
Sodium: 142 mmol/L (ref 135–145)
Total Bilirubin: 2.7 mg/dL — ABNORMAL HIGH (ref 0.3–1.2)
Total Protein: 8.1 g/dL (ref 6.5–8.1)

## 2020-01-30 LAB — URINALYSIS, ROUTINE W REFLEX MICROSCOPIC
Bacteria, UA: NONE SEEN
Bilirubin Urine: NEGATIVE
Glucose, UA: NEGATIVE mg/dL
Hgb urine dipstick: NEGATIVE
Ketones, ur: 5 mg/dL — AB
Leukocytes,Ua: NEGATIVE
Nitrite: NEGATIVE
Protein, ur: 100 mg/dL — AB
Specific Gravity, Urine: 1.029 (ref 1.005–1.030)
pH: 5 (ref 5.0–8.0)

## 2020-01-30 LAB — LIPASE, BLOOD: Lipase: 52 U/L — ABNORMAL HIGH (ref 11–51)

## 2020-01-30 MED ORDER — SODIUM CHLORIDE 0.9% FLUSH: 3.0000 mL | Freq: Once | INTRAVENOUS | Status: DC

## 2020-01-30 MED ORDER — ONDANSETRON HCL 4 MG/2ML IJ SOLN
4.0000 mg | Freq: Once | INTRAMUSCULAR | Status: AC
  Administered 2020-01-30: 4 mg via INTRAVENOUS
  Filled 2020-01-30: qty 2

## 2020-01-30 MED ORDER — IOHEXOL 300 MG/ML  SOLN
100.0000 mL | Freq: Once | INTRAMUSCULAR | Status: AC | PRN
  Administered 2020-01-30: 100 mL via INTRAVENOUS

## 2020-01-30 MED ORDER — HYDROMORPHONE HCL 1 MG/ML IJ SOLN
1.0000 mg | Freq: Once | INTRAMUSCULAR | Status: AC
  Administered 2020-01-30: 1 mg via INTRAMUSCULAR
  Filled 2020-01-30: qty 1

## 2020-01-30 MED ORDER — SODIUM CHLORIDE 0.9 % IV BOLUS
1000.0000 mL | Freq: Once | INTRAVENOUS | Status: AC
  Administered 2020-01-30: 1000 mL via INTRAVENOUS

## 2020-01-30 MED ORDER — FAMOTIDINE IN NACL 20-0.9 MG/50ML-% IV SOLN
20.0000 mg | Freq: Once | INTRAVENOUS | Status: AC
  Administered 2020-01-30: 20 mg via INTRAVENOUS
  Filled 2020-01-30: qty 50

## 2020-01-30 MED ORDER — SODIUM CHLORIDE (PF) 0.9 % IJ SOLN
INTRAMUSCULAR | Status: AC
  Filled 2020-01-30: qty 50

## 2020-01-30 NOTE — Discharge Instructions (Addendum)
It was our pleasure to provide your ER care today - we hope that you feel better.  Rest. Drink plenty of fluids.   Follow up with primary care doctor in the next 1-2 days if symptoms fail to improve/resolve.  Return to ER if worse, new symptoms, high fevers, worsening or severe pain, persistent vomiting, or other concern.   You were given pain medication in the ER - no driving for the next 6 hours.

## 2020-01-30 NOTE — ED Notes (Signed)
Patient given ginger ale and crackers. 

## 2020-01-30 NOTE — ED Notes (Signed)
Patient ambulated to restroom independently.

## 2020-01-30 NOTE — ED Provider Notes (Signed)
[facility]-EMERGENCY DEPT Provider Note   CSN: [id number] Arrival date & time: 01/30/20  2025     History Chief Complaint  Patient presents with  . Abdominal Pain    [name] is a 49 y.o. male.  Patient c/o abd pain and nausea, vomiting, diarrhea, onset last PM. Symptoms acute onset, moderate, persistent, recurrent, non radiating. Emesis clear to color ingested fluids, not bloody or bilious. Diarrhea watery, not bloody. Abd pain worse in RLQ, non radiating. No dysuria or hematuria. No cough or uri symptoms. No chest pain or discomfort. No known bad food ingestion or ill contacts. No fever.   The history is provided by the patient.  Abdominal Pain Associated symptoms: diarrhea and vomiting   Associated symptoms: no chest pain, no dysuria, no fever, no shortness of breath and no sore throat        Past Medical History:  Diagnosis Date  . GERD (gastroesophageal reflux disease)     There are no problems to display for this patient.   Past Surgical History:  Procedure Laterality Date  . BACK SURGERY  07/22/2005   to remove fragments and pieces that had come loose  . BACK SURGERY         No family history on file.  Social History   Tobacco Use  . Smoking status: Never Smoker  . Smokeless tobacco: Never Used  Substance Use Topics  . Alcohol use: No  . Drug use: No    Home Medications Prior to Admission medications   Medication Sig Start Date End Date Taking? Authorizing Provider  cephALEXin (KEFLEX) 500 MG capsule Take 1 capsule (500 mg total) by mouth 4 (four) times daily. 04/14/17   Burgess Amor, PA-C  HYDROcodone-acetaminophen (NORCO/VICODIN) 5-325 MG tablet Take 1 tablet by mouth every 4 (four) hours as needed. 04/14/17   Burgess Amor, PA-C    Allergies    Patient has no known allergies.  Review of Systems   Review of Systems  Constitutional: Negative for fever.  HENT: Negative for sore throat.   Eyes: Negative for redness.    Respiratory: Negative for shortness of breath.   Cardiovascular: Negative for chest pain.  Gastrointestinal: Positive for abdominal pain, diarrhea and vomiting.  Genitourinary: Negative for dysuria and flank pain.  Musculoskeletal: Negative for back pain and neck pain.  Skin: Negative for rash.  Neurological: Negative for headaches.  Hematological: Does not bruise/bleed easily.  Psychiatric/Behavioral: Negative for confusion.    Physical Exam Updated Vital Signs BP (!) 135/100   Pulse (!) 113   Temp 98.6 F (37 C) (Oral)   Resp 18   Ht 1.778 m (5\' 10" )   Wt 71.7 kg   SpO2 100%   BMI 22.67 kg/m   Physical Exam Vitals and nursing note reviewed.  Constitutional:      Appearance: Normal appearance. He is well-developed.  HENT:     Head: Atraumatic.     Nose: Nose normal.     Mouth/Throat:     Mouth: Mucous membranes are moist.     Pharynx: Oropharynx is clear.  Eyes:     General: No scleral icterus.    Conjunctiva/sclera: Conjunctivae normal.  Neck:     Trachea: No tracheal deviation.  Cardiovascular:     Rate and Rhythm: Normal rate and regular rhythm.     Pulses: Normal pulses.     Heart sounds: Normal heart sounds. No murmur. No friction rub. No gallop.   Pulmonary:     Effort:  Pulmonary effort is normal. No accessory muscle usage or respiratory distress.     Breath sounds: Normal breath sounds.  Abdominal:     General: Bowel sounds are normal. There is no distension.     Palpations: Abdomen is soft. There is no mass.     Tenderness: There is abdominal tenderness. There is no guarding or rebound.     Hernia: No hernia is present.     Comments: Right lower abd tenderness.   Genitourinary:    Comments: No cva tenderness. Musculoskeletal:        General: No swelling.     Cervical back: Normal range of motion and neck supple. No rigidity.  Skin:    General: Skin is warm and dry.     Findings: No rash.  Neurological:     Mental Status: He is alert.      Comments: Alert, speech clear.   Psychiatric:        Mood and Affect: Mood normal.     ED Results / Procedures / Treatments   Labs (all labs ordered are listed, but only abnormal results are displayed) Results for orders placed or performed during the hospital encounter of 01/30/20  Urinalysis, Routine w reflex microscopic  Result Value Ref Range   Color, Urine AMBER (A) YELLOW   APPearance HAZY (A) CLEAR   Specific Gravity, Urine 1.029 1.005 - 1.030   pH 5.0 5.0 - 8.0   Glucose, UA NEGATIVE NEGATIVE mg/dL   Hgb urine dipstick NEGATIVE NEGATIVE   Bilirubin Urine NEGATIVE NEGATIVE   Ketones, ur 5 (A) NEGATIVE mg/dL   Protein, ur 357 (A) NEGATIVE mg/dL   Nitrite NEGATIVE NEGATIVE   Leukocytes,Ua NEGATIVE NEGATIVE   RBC / HPF 0-5 0 - 5 RBC/hpf   WBC, UA 0-5 0 - 5 WBC/hpf   Bacteria, UA NONE SEEN NONE SEEN   Squamous Epithelial / LPF 0-5 0 - 5   Mucus PRESENT     EKG None  Radiology CT Abdomen Pelvis W Contrast  Result Date: 01/30/2020 CLINICAL DATA:  Acute abdominal pain since last night. EXAM: CT ABDOMEN AND PELVIS WITH CONTRAST TECHNIQUE: Multidetector CT imaging of the abdomen and pelvis was performed using the standard protocol following bolus administration of intravenous contrast. CONTRAST:  OMNIPAQUE IOHEXOL 300 MG/ML  SOLN COMPARISON:  MR abdomen from 2015 FINDINGS: Lower chest: Incidental imaging of the lung bases is unremarkable. Hepatobiliary: Liver is normal. Gallbladder is unremarkable. No biliary ductal dilation. Pancreas: Pancreas is normal without focal lesion or ductal dilation. Spleen: Spleen normal size without focal lesion. Adrenals/Urinary Tract: Adrenal glands are normal. Low-density lesions in the LEFT kidney, lower pole largest slightly less than a cm size likely cysts. No perinephric stranding or hydronephrosis. Nephrolithiasis in the lower pole the RIGHT kidney approximately 3 mm. Bifid renal collecting systems bilaterally. Single ureter distally  beyond the UPJ. Stomach/Bowel: Fluid-filled bowel loops distally. No sign of bowel obstruction. Question mild hyperenhancement or under distension of the terminal ileum. Fluid-filled ascending colon with under distended transverse colon no significant pericolonic stranding but with subjective increase in pericolonic vascularity. Bowel enhancement is maintained throughout the appendix is fluid-filled without stranding or wall thickening Vascular/Lymphatic: Vascular structures in the abdomen are patent. No adenopathy in the retroperitoneum or in the upper abdomen. No pelvic lymphadenopathy. Reproductive: Prostate unremarkable by CT. Urinary bladder is normal. Other: No ascites. No free air. No abdominal wall hernia. Musculoskeletal: No acute or destructive bone finding. IMPRESSION: 1. Enterocolitis is considered versus gastroenteritis. Correlate with  any history of repeated episodes of abdominal pain given the mild asymmetric hyperenhancement of the terminal ileum. GI follow-up may be helpful if there are persistent or recurrent symptoms. 2. Fluid-filled appendix without periappendiceal stranding or are dilation findings do not suggest appendicitis at this time. 3. Nonobstructive right nephrolithiasis. Electronically Signed   By: Zetta Bills M.D.   On: 01/30/2020 10:49    Procedures Procedures (including critical care time)  Medications Ordered in ED Medications  sodium chloride 0.9 % bolus 1,000 mL (has no administration in time range)  HYDROmorphone (DILAUDID) injection 1 mg (has no administration in time range)  ondansetron (ZOFRAN) injection 4 mg (has no administration in time range)  famotidine (PEPCID) IVPB 20 mg premix (has no administration in time range)    ED Course  I have reviewed the triage vital signs and the nursing notes.  Pertinent labs & imaging results that were available during my care of the patient were reviewed by me and considered in my medical decision making (see chart for  details).    MDM Rules/Calculators/A&P                      Patient had labs and [facility] ED this AM - reviewed - wbc mildly elev.   Reviewed nursing notes and prior charts for additional history.   Iv ns bolus. Dilaudid iv. pepcid iv. zofran iv.   Additional ivf.   Await CT.  CT reviewed/interpreted by me - c/w gastroenteritis.   Recheck pt, feels improved. Tolerating po.  Recheck abd soft nt.   Pt appear stable for d/c.   Return precautions provided.      Final Clinical Impression(s) / ED Diagnoses Final diagnoses:  None    Rx / DC Orders ED Discharge Orders    None       Lajean Saver, MD 01/30/20 1058

## 2020-01-30 NOTE — ED Triage Notes (Signed)
Pt states he suddenly became sick at 12am with vomiting and diarrhea. Pt is very soft spoken in triage and answers questions with one word- very hard to decipher exactly what is going on with pt. Pt is alert pt refusing to answer questions because he doesn't feel well.

## 2020-01-30 NOTE — ED Notes (Addendum)
Pt stated that he can not wait any longer. Tech advised pt to stay and be seen. Pt stated that he wants to go to a different hospital. Pt has left building. Triage nurse notified.

## 2020-01-30 NOTE — ED Notes (Signed)
Pt has been brought to lobby bathroom multiple times.

## 2020-01-30 NOTE — ED Triage Notes (Addendum)
RLQ abdominal pain since midnight. Sts vomiting and diarrhea. Sts blood and urine obtained pta at [facility].

## 2021-04-08 ENCOUNTER — Other Ambulatory Visit: Payer: Self-pay | Admitting: Sports Medicine

## 2021-04-08 DIAGNOSIS — M542 Cervicalgia: Secondary | ICD-10-CM

## 2021-04-08 DIAGNOSIS — M25512 Pain in left shoulder: Secondary | ICD-10-CM

## 2022-01-14 ENCOUNTER — Other Ambulatory Visit: Payer: Self-pay | Admitting: Physical Medicine and Rehabilitation

## 2022-01-14 ENCOUNTER — Ambulatory Visit
Admission: RE | Admit: 2022-01-14 | Discharge: 2022-01-14 | Disposition: A | Discharge status: Home / Self Care | Payer: Medicare HMO | Source: Ambulatory Visit | Attending: Physical Medicine and Rehabilitation | Admitting: Physical Medicine and Rehabilitation

## 2022-01-14 DIAGNOSIS — M25552 Pain in left hip: Secondary | ICD-10-CM

## 2022-01-28 ENCOUNTER — Other Ambulatory Visit: Payer: Self-pay | Admitting: Neurological Surgery

## 2022-01-28 DIAGNOSIS — M5416 Radiculopathy, lumbar region: Secondary | ICD-10-CM

## 2022-02-10 ENCOUNTER — Other Ambulatory Visit: Payer: Medicare HMO

## 2022-03-14 ENCOUNTER — Ambulatory Visit
Admission: RE | Admit: 2022-03-14 | Discharge: 2022-03-14 | Disposition: A | Discharge status: Home / Self Care | Payer: Medicare HMO | Source: Ambulatory Visit | Attending: Neurological Surgery | Admitting: Neurological Surgery

## 2022-03-14 DIAGNOSIS — M5416 Radiculopathy, lumbar region: Secondary | ICD-10-CM

## 2022-04-21 ENCOUNTER — Ambulatory Visit: Payer: Self-pay | Admitting: Surgery

## 2022-04-29 ENCOUNTER — Encounter (HOSPITAL_BASED_OUTPATIENT_CLINIC_OR_DEPARTMENT_OTHER): Payer: Self-pay | Admitting: Surgery

## 2022-04-29 NOTE — Progress Notes (Signed)
Spoke w/ via phone for pre-op interview--- pt Lab needs dos----   no            Lab results------ no COVID test -----patient states asymptomatic no test needed Arrive at ------- 0530 on 05-01-2022 NPO after MN NO Solid Food.  Clear liquids from MN until--- 0430 Med rec completed Medications to take morning of surgery ----- prilosec Diabetic medication ----- n/a Patient instructed no nail polish to be worn day of surgery Patient instructed to bring photo id and insurance card day of surgery Patient aware to have Driver (ride ) / caregiver for 24 hours after surgery --  wife, Shanda Bumps Patient Special Instructions ----- n/a Pre-Op special Istructions ----- n/a Patient verbalized understanding of instructions that were given at this phone interview. Patient denies shortness of breath, chest pain, fever, cough at this phone interview.

## 2022-04-30 ENCOUNTER — Encounter (HOSPITAL_BASED_OUTPATIENT_CLINIC_OR_DEPARTMENT_OTHER): Payer: Self-pay | Admitting: Surgery

## 2022-04-30 DIAGNOSIS — K4090 Unilateral inguinal hernia, without obstruction or gangrene, not specified as recurrent: Secondary | ICD-10-CM

## 2022-04-30 NOTE — H&P (Signed)
REFERRING PHYSICIAN: Princella Pellegrini, PA  PROVIDER: Skii Cleland Myra Rude, MD   Chief Complaint: New Consultation (Left Inguinal Hernia )  History of Present Illness:  Patient is referred by the urgent care where he was evaluated approximately 1 month ago for left groin pain and a visible bulge. Patient was seen and evaluated and diagnosed with left inguinal hernia. Patient did not note any particular incident or lifting injury that caused the hernia. He is restricted in his activities due to previous back surgery. He has had no signs or symptoms of obstruction. He has had no prior hernia repair. He has had no prior abdominal surgery. Patient presents today for further evaluation and recommendations for management.  Review of Systems: A complete review of systems was obtained from the patient. I have reviewed this information and discussed as appropriate with the patient. See HPI as well for other ROS.  Review of Systems  Constitutional: Negative.  HENT: Negative.  Eyes: Negative.  Respiratory: Negative.  Cardiovascular: Negative.  Gastrointestinal: Negative.  Genitourinary: Negative.  Musculoskeletal: Positive for back pain.  Left groin pain  Skin: Negative.  Neurological: Negative.  Endo/Heme/Allergies: Negative.  Psychiatric/Behavioral: Negative.   Medical History: Past Medical History:  Diagnosis Date  Arthritis  GERD (gastroesophageal reflux disease)   Patient Active Problem List  Diagnosis  Arachnoid cyst  Left inguinal hernia   Past Surgical History:  Procedure Laterality Date  Lower Back Surgery  11/2004,07/2005,03/2012    No Known Allergies  Current Outpatient Medications on File Prior to Visit  Medication Sig Dispense Refill  acetaminophen (TYLENOL) 500 MG tablet Take 1,000 mg by mouth every 8 (eight) hours as needed for Pain  multivitamin tablet Take 1 tablet by mouth once daily  nabumetone (RELAFEN) 500 MG tablet Take 500 mg by mouth 2 (two) times  daily as needed  omeprazole (PRILOSEC) 20 MG DR capsule Take 20 mg by mouth twice a week  peg 400-propylene glycol, PF, (SYSTANE ULTRA) 0.4-0.3 % ophthalmic drops Place 1 drop into both eyes as needed for Dry Eyes   No current facility-administered medications on file prior to visit.   History reviewed. No pertinent family history.   Social History   Tobacco Use  Smoking Status Never  Smokeless Tobacco Never    Social History   Socioeconomic History  Marital status: Married  Tobacco Use  Smoking status: Never  Smokeless tobacco: Never  Vaping Use  Vaping Use: Never used  Substance and Sexual Activity  Alcohol use: Not Currently   Objective:   Vitals:  BP: (!) 142/82  Pulse: 101  Temp: 36.8 C (98.3 F)  SpO2: 97%  Weight: 71.9 kg (158 lb 9.6 oz)  Height: 177.8 cm (5\' 10" )   Body mass index is 22.76 kg/m.  Physical Exam   GENERAL APPEARANCE Comfortable, no acute issues Development: normal Gross deformities: none  SKIN Rash, lesions, ulcers: none Induration, erythema: none Nodules: none palpable  EYES Conjunctiva and lids: normal Pupils: equal and reactive  EARS, NOSE, MOUTH, THROAT External ears: no lesion or deformity External nose: no lesion or deformity Hearing: grossly normal  NECK Symmetric: yes Trachea: midline Thyroid: no palpable nodules in the thyroid bed  CHEST Respiratory effort: normal Retraction or accessory muscle use: no Breath sounds: normal bilaterally Rales, rhonchi, wheeze: none  CARDIOVASCULAR Auscultation: regular rhythm, normal rate Murmurs: none Pulses: radial pulse 2+ palpable Lower extremity edema: none  ABDOMEN Soft without distention. No sign of umbilical hernia.  GENITOURINARY/RECTAL Normal male genitalia without  mass or lesion. Palpation in the right inguinal canal with cough and Valsalva shows no sign of hernia. On the left there is a visible bulge. On palpation there is a left inguinal hernia which  augments with cough and Valsalva. It is reducible but spontaneously prolapses. There is mild tenderness.  MUSCULOSKELETAL Station and gait: normal Digits and nails: no clubbing or cyanosis Muscle strength: grossly normal all extremities Range of motion: grossly normal all extremities Deformity: none  LYMPHATIC Cervical: none palpable Supraclavicular: none palpable  PSYCHIATRIC Oriented to person, place, and time: yes Mood and affect: normal for situation Judgment and insight: appropriate for situation   Assessment and Plan:   Left inguinal hernia  Patient is referred from the urgent care with a newly diagnosed left inguinal hernia. Patient is provided with written literature on hernia surgery to review at home.  Patient has a reducible left inguinal hernia. I have recommended repair by open technique using mesh. We discussed the risk of recurrence being approximately 2%. We discussed the size and location of the surgical incision. We discussed restrictions on his activities after surgery. The patient understands and agrees to proceed with surgery in the near future.   Darnell Level, MD [facility] A [facility] practice Office: [phone]

## 2022-04-30 NOTE — Anesthesia Preprocedure Evaluation (Addendum)
Anesthesia Evaluation  Patient identified by MRN, date of birth, ID band Patient awake    Reviewed: Allergy & Precautions, NPO status , Patient's Chart, lab work & pertinent test results  Airway Mallampati: II  TM Distance: >3 FB Neck ROM: Full    Dental  (+) Dental Advisory Given   Pulmonary neg pulmonary ROS,    breath sounds clear to auscultation       Cardiovascular negative cardio ROS   Rhythm:Regular Rate:Normal     Neuro/Psych negative neurological ROS     GI/Hepatic Neg liver ROS, GERD  ,  Endo/Other  negative endocrine ROS  Renal/GU negative Renal ROS     Musculoskeletal  (+) Arthritis ,   Abdominal   Peds  Hematology negative hematology ROS (+)   Anesthesia Other Findings   Reproductive/Obstetrics                            Anesthesia Physical Anesthesia Plan  ASA: 2  Anesthesia Plan: General   Post-op Pain Management: Tylenol PO (pre-op)* and Toradol IV (intra-op)*   Induction: Intravenous  PONV Risk Score and Plan: 2 and Dexamethasone, Ondansetron, Midazolam and Treatment may vary due to age or medical condition  Airway Management Planned: LMA  Additional Equipment: None  Intra-op Plan:   Post-operative Plan: Extubation in OR  Informed Consent: I have reviewed the patients History and Physical, chart, labs and discussed the procedure including the risks, benefits and alternatives for the proposed anesthesia with the patient or authorized representative who has indicated his/her understanding and acceptance.     Dental advisory given  Plan Discussed with: CRNA  Anesthesia Plan Comments:        Anesthesia Quick Evaluation

## 2022-05-01 ENCOUNTER — Other Ambulatory Visit: Payer: Self-pay

## 2022-05-01 ENCOUNTER — Encounter (HOSPITAL_BASED_OUTPATIENT_CLINIC_OR_DEPARTMENT_OTHER)
Admission: RE | Disposition: A | Discharge status: Home / Self Care | Payer: Self-pay | Source: Home / Self Care | Attending: Surgery

## 2022-05-01 ENCOUNTER — Ambulatory Visit (HOSPITAL_BASED_OUTPATIENT_CLINIC_OR_DEPARTMENT_OTHER)
Admission: RE | Admit: 2022-05-01 | Discharge: 2022-05-01 | Disposition: A | Discharge status: Home / Self Care | Payer: Medicare HMO | Attending: Surgery | Admitting: Surgery

## 2022-05-01 ENCOUNTER — Ambulatory Visit (HOSPITAL_BASED_OUTPATIENT_CLINIC_OR_DEPARTMENT_OTHER): Payer: Medicare HMO | Admitting: Anesthesiology

## 2022-05-01 ENCOUNTER — Encounter (HOSPITAL_BASED_OUTPATIENT_CLINIC_OR_DEPARTMENT_OTHER): Payer: Self-pay | Admitting: Surgery

## 2022-05-01 DIAGNOSIS — K4090 Unilateral inguinal hernia, without obstruction or gangrene, not specified as recurrent: Secondary | ICD-10-CM

## 2022-05-01 DIAGNOSIS — K219 Gastro-esophageal reflux disease without esophagitis: Secondary | ICD-10-CM | POA: Insufficient documentation

## 2022-05-01 DIAGNOSIS — Z01818 Encounter for other preprocedural examination: Secondary | ICD-10-CM

## 2022-05-01 HISTORY — DX: Other intervertebral disc degeneration, lumbosacral region without mention of lumbar back pain or lower extremity pain: M51.379

## 2022-05-01 HISTORY — DX: Calculus of kidney: N20.0

## 2022-05-01 HISTORY — DX: Nocturia: R35.1

## 2022-05-01 HISTORY — DX: Chronic cough: R05.3

## 2022-05-01 HISTORY — DX: Other complications of anesthesia, initial encounter: T88.59XA

## 2022-05-01 HISTORY — DX: Family history of malignant neoplasm of kidney: Z80.51

## 2022-05-01 HISTORY — DX: Cyst of kidney, acquired: N28.1

## 2022-05-01 HISTORY — PX: INGUINAL HERNIA REPAIR: SHX194

## 2022-05-01 HISTORY — DX: Cerebral cysts: G93.0

## 2022-05-01 HISTORY — DX: Unilateral inguinal hernia, without obstruction or gangrene, not specified as recurrent: K40.90

## 2022-05-01 SURGERY — REPAIR, HERNIA, INGUINAL, ADULT
Anesthesia: General | Site: Groin | Laterality: Left

## 2022-05-01 MED ORDER — ROCURONIUM BROMIDE 10 MG/ML (PF) SYRINGE
PREFILLED_SYRINGE | INTRAVENOUS | Status: AC
  Filled 2022-05-01: qty 10

## 2022-05-01 MED ORDER — TRAMADOL HCL 50 MG PO TABS: 50.0000 mg | ORAL_TABLET | Freq: Four times a day (QID) | ORAL | 0 refills | Status: AC | PRN

## 2022-05-01 MED ORDER — ONDANSETRON HCL 4 MG/2ML IJ SOLN
INTRAMUSCULAR | Status: AC
  Filled 2022-05-01: qty 2

## 2022-05-01 MED ORDER — FENTANYL CITRATE (PF) 100 MCG/2ML IJ SOLN
INTRAMUSCULAR | Status: DC | PRN
  Administered 2022-05-01 (×2): 50 ug via INTRAVENOUS

## 2022-05-01 MED ORDER — BUPIVACAINE HCL 0.5 % IJ SOLN
INTRAMUSCULAR | Status: DC | PRN
  Administered 2022-05-01: 20 mL

## 2022-05-01 MED ORDER — CHLORHEXIDINE GLUCONATE CLOTH 2 % EX PADS: 6.0000 | MEDICATED_PAD | Freq: Once | CUTANEOUS | Status: DC

## 2022-05-01 MED ORDER — FENTANYL CITRATE (PF) 100 MCG/2ML IJ SOLN
INTRAMUSCULAR | Status: AC
  Filled 2022-05-01: qty 2

## 2022-05-01 MED ORDER — OXYCODONE HCL 5 MG PO TABS: 5.0000 mg | ORAL_TABLET | Freq: Once | ORAL | Status: DC | PRN

## 2022-05-01 MED ORDER — DEXAMETHASONE SODIUM PHOSPHATE 10 MG/ML IJ SOLN
INTRAMUSCULAR | Status: AC
  Filled 2022-05-01: qty 1

## 2022-05-01 MED ORDER — PROPOFOL 10 MG/ML IV BOLUS
INTRAVENOUS | Status: AC
  Filled 2022-05-01: qty 20

## 2022-05-01 MED ORDER — FENTANYL CITRATE (PF) 100 MCG/2ML IJ SOLN
25.0000 ug | INTRAMUSCULAR | Status: DC | PRN
  Administered 2022-05-01 (×3): 25 ug via INTRAVENOUS

## 2022-05-01 MED ORDER — LIDOCAINE HCL (PF) 2 % IJ SOLN
INTRAMUSCULAR | Status: AC
  Filled 2022-05-01: qty 5

## 2022-05-01 MED ORDER — ONDANSETRON HCL 4 MG/2ML IJ SOLN
INTRAMUSCULAR | Status: DC | PRN
  Administered 2022-05-01: 4 mg via INTRAVENOUS

## 2022-05-01 MED ORDER — CEFAZOLIN SODIUM-DEXTROSE 2-4 GM/100ML-% IV SOLN
2.0000 g | INTRAVENOUS | Status: AC
  Administered 2022-05-01: 2 g via INTRAVENOUS

## 2022-05-01 MED ORDER — MIDAZOLAM HCL 2 MG/2ML IJ SOLN
INTRAMUSCULAR | Status: AC
  Filled 2022-05-01: qty 2

## 2022-05-01 MED ORDER — MIDAZOLAM HCL 2 MG/2ML IJ SOLN
INTRAMUSCULAR | Status: DC | PRN
  Administered 2022-05-01: 2 mg via INTRAVENOUS

## 2022-05-01 MED ORDER — KETOROLAC TROMETHAMINE 30 MG/ML IJ SOLN
INTRAMUSCULAR | Status: DC | PRN
  Administered 2022-05-01: 30 mg via INTRAVENOUS

## 2022-05-01 MED ORDER — LIDOCAINE 2% (20 MG/ML) 5 ML SYRINGE
INTRAMUSCULAR | Status: DC | PRN
  Administered 2022-05-01: 60 mg via INTRAVENOUS

## 2022-05-01 MED ORDER — ROCURONIUM BROMIDE 10 MG/ML (PF) SYRINGE
PREFILLED_SYRINGE | INTRAVENOUS | Status: DC | PRN
  Administered 2022-05-01: 50 mg via INTRAVENOUS

## 2022-05-01 MED ORDER — DEXAMETHASONE SODIUM PHOSPHATE 10 MG/ML IJ SOLN
INTRAMUSCULAR | Status: DC | PRN
  Administered 2022-05-01: 10 mg via INTRAVENOUS

## 2022-05-01 MED ORDER — CEFAZOLIN SODIUM-DEXTROSE 2-4 GM/100ML-% IV SOLN
INTRAVENOUS | Status: AC
  Filled 2022-05-01: qty 100

## 2022-05-01 MED ORDER — ACETAMINOPHEN 500 MG PO TABS
ORAL_TABLET | ORAL | Status: AC
  Filled 2022-05-01: qty 2

## 2022-05-01 MED ORDER — BUPIVACAINE LIPOSOME 1.3 % IJ SUSP
INTRAMUSCULAR | Status: AC
  Filled 2022-05-01: qty 20

## 2022-05-01 MED ORDER — BUPIVACAINE LIPOSOME 1.3 % IJ SUSP
INTRAMUSCULAR | Status: DC | PRN
  Administered 2022-05-01: 20 mL

## 2022-05-01 MED ORDER — OXYCODONE HCL 5 MG/5ML PO SOLN: 5.0000 mg | Freq: Once | ORAL | Status: DC | PRN

## 2022-05-01 MED ORDER — KETOROLAC TROMETHAMINE 30 MG/ML IJ SOLN
INTRAMUSCULAR | Status: AC
  Filled 2022-05-01: qty 1

## 2022-05-01 MED ORDER — PROPOFOL 10 MG/ML IV BOLUS
INTRAVENOUS | Status: DC | PRN
  Administered 2022-05-01: 150 mg via INTRAVENOUS

## 2022-05-01 MED ORDER — SUGAMMADEX SODIUM 200 MG/2ML IV SOLN
INTRAVENOUS | Status: DC | PRN
  Administered 2022-05-01: 150 mg via INTRAVENOUS

## 2022-05-01 MED ORDER — BUPIVACAINE HCL (PF) 0.5 % IJ SOLN
INTRAMUSCULAR | Status: AC
  Filled 2022-05-01: qty 30

## 2022-05-01 MED ORDER — ACETAMINOPHEN 500 MG PO TABS
1000.0000 mg | ORAL_TABLET | Freq: Once | ORAL | Status: AC
  Administered 2022-05-01: 1000 mg via ORAL

## 2022-05-01 MED ORDER — BUPIVACAINE LIPOSOME 1.3 % IJ SUSP: 20.0000 mL | Freq: Once | INTRAMUSCULAR | Status: DC

## 2022-05-01 MED ORDER — LACTATED RINGERS IV SOLN: INTRAVENOUS | Status: DC

## 2022-05-01 MED ORDER — AMISULPRIDE (ANTIEMETIC) 5 MG/2ML IV SOLN: 10.0000 mg | Freq: Once | INTRAVENOUS | Status: DC | PRN

## 2022-05-01 SURGICAL SUPPLY — 44 items
ADH SKN CLS APL DERMABOND .7 (GAUZE/BANDAGES/DRESSINGS) ×1
APL PRP STRL LF DISP 70% ISPRP (MISCELLANEOUS) ×1
BLADE CLIPPER SENSICLIP SURGIC (BLADE) ×3 IMPLANT
BLADE SURG 15 STRL LF DISP TIS (BLADE) ×2 IMPLANT
BLADE SURG 15 STRL SS (BLADE) ×2
CANISTER SUCT 1200ML W/VALVE (MISCELLANEOUS) IMPLANT
CHLORAPREP W/TINT 26 (MISCELLANEOUS) ×3 IMPLANT
COVER BACK TABLE 60X90IN (DRAPES) ×3 IMPLANT
COVER MAYO STAND STRL (DRAPES) ×3 IMPLANT
DECANTER SPIKE VIAL GLASS SM (MISCELLANEOUS) ×2 IMPLANT
DERMABOND ADVANCED (GAUZE/BANDAGES/DRESSINGS) ×1
DERMABOND ADVANCED .7 DNX12 (GAUZE/BANDAGES/DRESSINGS) ×2 IMPLANT
DRAIN PENROSE 0.5X18 (DRAIN) ×1 IMPLANT
DRAPE LAPAROTOMY TRNSV 102X78 (DRAPES) ×3 IMPLANT
DRAPE UTILITY XL STRL (DRAPES) ×3 IMPLANT
ELECT REM PT RETURN 9FT ADLT (ELECTROSURGICAL) ×2
ELECTRODE REM PT RTRN 9FT ADLT (ELECTROSURGICAL) ×2 IMPLANT
GAUZE 4X4 16PLY ~~LOC~~+RFID DBL (SPONGE) ×3 IMPLANT
GLOVE SURG ORTHO 8.0 STRL STRW (GLOVE) ×3 IMPLANT
GOWN STRL REUS W/TWL XL LVL3 (GOWN DISPOSABLE) ×4 IMPLANT
KIT TURNOVER CYSTO (KITS) ×3 IMPLANT
MANIFOLD NEPTUNE II (INSTRUMENTS) ×1 IMPLANT
MESH ULTRAPRO 3X6 7.6X15CM (Mesh General) ×3 IMPLANT
NDL HYPO 25X1 1.5 SAFETY (NEEDLE) ×2 IMPLANT
NEEDLE HYPO 25X1 1.5 SAFETY (NEEDLE) ×2 IMPLANT
NS IRRIG 500ML POUR BTL (IV SOLUTION) ×3 IMPLANT
PACK BASIN DAY SURGERY FS (CUSTOM PROCEDURE TRAY) ×3 IMPLANT
PENCIL SMOKE EVACUATOR (MISCELLANEOUS) ×3 IMPLANT
SPONGE T-LAP 18X18 ~~LOC~~+RFID (SPONGE) IMPLANT
SPONGE T-LAP 4X18 ~~LOC~~+RFID (SPONGE) IMPLANT
SUT MNCRL AB 4-0 PS2 18 (SUTURE) ×3 IMPLANT
SUT NOVA 0 T19/GS 22DT (SUTURE) IMPLANT
SUT NOVA NAB DX-16 0-1 5-0 T12 (SUTURE) IMPLANT
SUT NOVA NAB GS-22 2 0 T19 (SUTURE) ×6 IMPLANT
SUT SILK 2 0 (SUTURE)
SUT SILK 2 0 SH (SUTURE) ×1 IMPLANT
SUT SILK 2-0 18XBRD TIE 12 (SUTURE) IMPLANT
SUT VIC AB 3-0 SH 18 (SUTURE) ×3 IMPLANT
SYR BULB EAR ULCER 3OZ GRN STR (SYRINGE) ×3 IMPLANT
SYR CONTROL 10ML LL (SYRINGE) ×3 IMPLANT
TOWEL OR 17X26 10 PK STRL BLUE (TOWEL DISPOSABLE) ×3 IMPLANT
TUBE CONNECTING 12X1/4 (SUCTIONS) ×3 IMPLANT
WATER STERILE IRR 500ML POUR (IV SOLUTION) IMPLANT
YANKAUER SUCT BULB TIP NO VENT (SUCTIONS) ×3 IMPLANT

## 2022-05-01 NOTE — Discharge Instructions (Addendum)
[facility]  HERNIA REPAIR POST OP INSTRUCTIONS  Always review your discharge instruction sheet given to you by the facility where your surgery was performed.  A  prescription for pain medication may be sent to your pharmacy on discharge.  Take your pain medication as prescribed.  If narcotic pain medicine is not needed, then you may take acetaminophen (Tylenol) or ibuprofen (Advil) as needed.  Take your usually prescribed medications unless otherwise directed.  If you need a refill on your pain medication, please contact your pharmacy.  They will contact our office to request authorization. Prescriptions will not be filled after 5:00 PM daily or on weekends.  You should follow a light diet the first 24 hours after arrival home, such as soup and crackers or toast.  Be sure to include plenty of fluids daily.  Resume your normal diet the day after surgery.  Most patients will experience some swelling and bruising around the surgical site.  Ice packs and reclining will help.  Swelling and bruising can take several days to resolve.   It is common to experience some constipation if taking pain medication after surgery.  Increasing fluid intake and taking a stool softener (such as Colace) will usually help or prevent this problem from occurring.  A mild laxative (Milk of Magnesia or Miralax) should be taken according to package directions if there is no bowel movement after 48 hours.  You will likely have Dermabond (topical glue) over your incisions.  This seals the incisions and allows you to bathe and shower at any time after your surgery.  Glue should remain in place for up to 10 days.  It may be removed after 10 days by pealing off the Dermabond material or using Vaseline or naval jelly to remove.  ACTIVITIES:  You may resume regular (light) daily activities beginning the next day - such as daily self-care, walking, climbing stairs - gradually increasing activities as tolerated.  You  may have sexual intercourse when it is comfortable.  Refrain from any heavy lifting or straining until approved by your doctor.  You may drive when you are no longer taking prescription pain medication, when you can comfortably wear a seatbelt, and when you can safely maneuver your car and apply the brakes.  You should see your doctor in the office for a follow-up appointment approximately 2-3 weeks after your surgery.  Make sure that you call for this appointment within a day or two after you arrive home to insure a convenient appointment time.  WHEN TO CALL YOUR DOCTOR: Fever greater than 101.0 Inability to urinate Persistent nausea and/or vomiting Extreme swelling or bruising Continued bleeding from incision Increased pain, redness, or drainage from the incision  The clinic staff is available to answer your questions during regular business hours.  Please don't hesitate to call and ask to speak to one of the nurses for clinical concerns.  If you have a medical emergency, go to the nearest emergency room or call 911.  A surgeon from [facility] is always on call for the hospital.   [facility] [address], [place], [place]  [address]  [phone] ? [phone] ? FAX [phone]     No acetaminophen/Tylenol until after 12:00pm today if needed for pain.    No ibuprofen, Advil, Aleve, Motrin, ketorolac, meloxicam, naproxen, or other NSAIDS until after 3:45pm today if needed for pain.       Information for Discharge Teaching: EXPAREL (bupivacaine liposome injectable  suspension)   Your surgeon or anesthesiologist gave you EXPAREL(bupivacaine) to help control your pain after surgery.  EXPAREL is a local anesthetic that provides pain relief by numbing the tissue around the surgical site. EXPAREL is designed to release pain medication over time and can control pain for up to 72 hours. Depending on how you respond to EXPAREL, you may  require less pain medication during your recovery.  Possible side effects: Temporary loss of sensation or ability to move in the area where bupivacaine was injected. Nausea, vomiting, constipation Rarely, numbness and tingling in your mouth or lips, lightheadedness, or anxiety may occur. Call your doctor right away if you think you may be experiencing any of these sensations, or if you have other questions regarding possible side effects.  Follow all other discharge instructions given to you by your surgeon or nurse. Eat a healthy diet and drink plenty of water or other fluids.  If you return to the hospital for any reason within 96 hours following the administration of EXPAREL, it is important for health care providers to know that you have received this anesthetic. A teal colored band has been placed on your arm with the date, time and amount of EXPAREL you have received in order to alert and inform your health care providers. Please leave this armband in place for the full 96 hours following administration, and then you may remove the band.    Band can be removed on Tuesday May 05, 2022.     Post Anesthesia Home Care Instructions  Activity: Get plenty of rest for the remainder of the day. A responsible individual must stay with you for 24 hours following the procedure.  For the next 24 hours, DO NOT: -Drive a car -Advertising copywriter -Drink alcoholic beverages -Take any medication unless instructed by your physician -Make any legal decisions or sign important papers.  Meals: Start with liquid foods such as gelatin or soup. Progress to regular foods as tolerated. Avoid greasy, spicy, heavy foods. If nausea and/or vomiting occur, drink only clear liquids until the nausea and/or vomiting subsides. Call your physician if vomiting continues.  Special Instructions/Symptoms: Your throat may feel dry or sore from the anesthesia or the breathing tube placed in your throat during surgery.  If this causes discomfort, gargle with warm salt water. The discomfort should disappear within 24 hours.

## 2022-05-01 NOTE — Anesthesia Procedure Notes (Signed)
Procedure Name: Intubation Date/Time: 05/01/2022 7:41 AM  Performed by: Mechele Claude, CRNAPre-anesthesia Checklist: Patient identified, Emergency Drugs available, Suction available and Patient being monitored Patient Re-evaluated:Patient Re-evaluated prior to induction Oxygen Delivery Method: Circle system utilized Preoxygenation: Pre-oxygenation with 100% oxygen Induction Type: IV induction Ventilation: Mask ventilation without difficulty Laryngoscope Size: Mac and 4 Grade View: Grade I Tube type: Oral Tube size: 7.5 mm Number of attempts: 1 Airway Equipment and Method: Stylet and Oral airway Placement Confirmation: ETT inserted through vocal cords under direct vision, positive ETCO2 and breath sounds checked- equal and bilateral Secured at: 24 cm Tube secured with: Tape Dental Injury: Teeth and Oropharynx as per pre-operative assessment

## 2022-05-01 NOTE — Transfer of Care (Signed)
Immediate Anesthesia Transfer of Care Note  Patient: [name]  Procedure(s) Performed: Procedure(s) (LRB): OPEN REPAIR LEFT INGUINAL HERNIA WITH MESH (Left)  Patient Location: PACU  Anesthesia Type: General  Level of Consciousness: awake, alert  and oriented  Airway & Oxygen Therapy: Patient Spontanous Breathing and Patient connected to nasal cannula oxygen  Post-op Assessment: Report given to PACU RN and Post -op Vital signs reviewed and stable  Post vital signs: Reviewed and stable  Complications: No apparent anesthesia complications Post vital signs:   Last Vitals:  Vitals Value Taken Time  BP 127/87 05/01/22 0900  Temp    Pulse 73 05/01/22 0900  Resp 22 05/01/22 0903  SpO2 95 % 05/01/22 0900  Vitals shown include unvalidated device data.  Last Pain:  Vitals:   05/01/22 0534  TempSrc: Oral  PainSc: 0-No pain      Patients Stated Pain Goal: 3 (05/01/22 0534)  Complications: No notable events documented.

## 2022-05-01 NOTE — Op Note (Signed)
Procedure Note  Pre-operative Diagnosis:  left inguinal hernia  Post-operative Diagnosis: same  Procedure:  Open left inguinal hernia repair with mesh  Surgeon:  Darnell Level, MD  Anesthesia:  General  Preparation:  Chlora-prep  Estimated Blood Loss: minimal  Complications:  none  Indications: The patient presented with a left, reducible hernia.  Patient is referred by the urgent care where he was evaluated approximately 1 month ago for left groin pain and a visible bulge. Patient was seen and evaluated and diagnosed with left inguinal hernia. Patient did not note any particular incident or lifting injury that caused the hernia. He is restricted in his activities due to previous back surgery. He has had no signs or symptoms of obstruction. He has had no prior hernia repair. Patient now comes to surgery for repair  Procedure Details  The patient was evaluated in the holding area. All of the patient's questions were answered and the proposed procedure was confirmed. The site of the procedure was properly marked. The patient was taken to the Operating Room, identified by name, and the procedure verified as inguinal hernia repair.  The patient was placed in the supine position and underwent induction of anesthesia. A "Time Out" was performed per routine. The lower abdomen and groin were prepped and draped in the usual aseptic fashion.  After ascertaining that an adequate level of anesthesia had been obtained, an incision was made in the groin with a #10 blade.  Dissection was carried through the subcutaneous tissues and hemostasis obtained with the electrocautery.  A Gelpi retractor was placed for exposure.  The external oblique fascia was incised in line with it's fibers and extended through the external inguinal ring.  The cord structures were dissected out of the inguinal canal and encircled with a Penrose drain.  The floor of the inguinal canal was dissected out.  There was no sign of a direct  defect.  The cord was explored and a moderate sized indirect sac was dissected out.  A high ligation was performed with a 2-0 silk suture ligature and the sac excised and discarded.  The floor of the inguinal canal was reconstructed with Ethicon Ultrapro mesh cut to the appropriate dimensions.  It was secured to the pubic tubercle with a 2-0 Novafil suture and along the inguinal ligament with a running 2-0 Novafil suture.  Mesh was split to accommodate the cord structures.  The superior margin of the mesh was secured to the transversalis and internal oblique musculature with interrupted 2-0 Novafil sutures.  The tails of the mesh were overlapped lateral to the cord structures and secured to the inguinal ligament with interrupted 2-0 Novafil sutures to recreate the internal inguinal ring.  Cord structures were returned to the inguinal canal.  Local anesthetic using a mixture of Exparel and Marcaine was infiltrated throughout the field.  External oblique fascia was closed with interrupted 3-0 Vicryl sutures.  Subcutaneous tissues were closed with interrupted 3-0 Vicryl sutures.  Skin was anesthetized with local anesthetic using a mixture of Exparel and Marcaine, and the skin edges were re-approximated with a running 4-0 Monocryl suture.  Wound was washed and dried and Dermabond was applied.  Instrument, sponge, and needle counts were correct prior to closure and at the conclusion of the case.  The patient tolerated the procedure well.  The patient was awakened from anesthesia and brought to the recovery room in stable condition.  Darnell Level, MD [facility] Surgery Office: [phone]

## 2022-05-01 NOTE — Anesthesia Postprocedure Evaluation (Signed)
Anesthesia Post Note  Patient: [name]  Procedure(s) Performed: OPEN REPAIR LEFT INGUINAL HERNIA WITH MESH (Left: Groin)     Patient location during evaluation: PACU Anesthesia Type: General Level of consciousness: awake and alert Pain management: pain level controlled Vital Signs Assessment: post-procedure vital signs reviewed and stable Respiratory status: spontaneous breathing, nonlabored ventilation, respiratory function stable and patient connected to nasal cannula oxygen Cardiovascular status: blood pressure returned to baseline and stable Postop Assessment: no apparent nausea or vomiting Anesthetic complications: no   No notable events documented.  Last Vitals:  Vitals:   05/01/22 1102 05/01/22 1157  BP: (!) 148/95 (!) 156/89  Pulse: (!) 58 (!) 56  Resp: 16 18  Temp: 36.7 C   SpO2: 100% 100%    Last Pain:  Vitals:   05/01/22 1157  TempSrc:   PainSc: 3                  Kennieth Rad

## 2022-05-01 NOTE — Interval H&P Note (Signed)
History and Physical Interval Note:  05/01/2022 7:16 AM  [name]  has presented today for surgery, with the diagnosis of LEFT INGUINAL HERNIA, REDUCIBLE.  The various methods of treatment have been discussed with the patient and family. After consideration of risks, benefits and other options for treatment, the patient has consented to    Procedure(s): OPEN REPAIR LEFT INGUINAL HERNIA WITH MESH (Left) as a surgical intervention.    The patient's history has been reviewed, patient examined, no change in status, stable for surgery.  I have reviewed the patient's chart and labs.  Questions were answered to the patient's satisfaction.    Darnell Level, MD [facility] A [facility] practice Office: [phone]   Darnell Level

## 2022-05-04 ENCOUNTER — Encounter (HOSPITAL_BASED_OUTPATIENT_CLINIC_OR_DEPARTMENT_OTHER): Payer: Self-pay | Admitting: Surgery
